# Patient Record
Sex: Male | Born: 1985
Health system: Southern US, Community
[De-identification: ages and names within clinical notes are randomized; demographics above are authoritative.]

## PROBLEM LIST (undated history)

## (undated) DIAGNOSIS — G4733 Obstructive sleep apnea (adult) (pediatric): Secondary | ICD-10-CM

## (undated) DIAGNOSIS — G4730 Sleep apnea, unspecified: Secondary | ICD-10-CM

## (undated) DIAGNOSIS — M5126 Other intervertebral disc displacement, lumbar region: Secondary | ICD-10-CM

## (undated) DIAGNOSIS — T7840XA Allergy, unspecified, initial encounter: Secondary | ICD-10-CM

## (undated) HISTORY — DX: Allergy, unspecified, initial encounter: T78.40XA

## (undated) HISTORY — DX: Sleep apnea, unspecified: G47.30

## (undated) HISTORY — DX: Other intervertebral disc displacement, lumbar region: M51.26

## (undated) HISTORY — DX: Obstructive sleep apnea (adult) (pediatric): G47.33

## (undated) HISTORY — PX: VASECTOMY: SHX75

---

## 2010-03-29 ENCOUNTER — Emergency Department: Payer: Self-pay | Admitting: Unknown Physician Specialty

## 2010-04-29 ENCOUNTER — Ambulatory Visit: Payer: Self-pay | Admitting: Pain Medicine

## 2010-05-17 ENCOUNTER — Ambulatory Visit: Payer: Self-pay | Admitting: Pain Medicine

## 2010-05-25 ENCOUNTER — Ambulatory Visit: Payer: Self-pay | Admitting: Pain Medicine

## 2010-06-01 ENCOUNTER — Ambulatory Visit: Payer: Self-pay | Admitting: Pain Medicine

## 2010-06-16 ENCOUNTER — Ambulatory Visit: Payer: Self-pay | Admitting: Pain Medicine

## 2010-09-19 IMAGING — CR DG LUMBAR SPINE 2-3V
1 series · 3 of 3 positions shown · non-contrast
Comparison: none

REASON FOR EXAM: baclk pain lower
COMMENTS:

[Series 1: view not recorded · 0.17mm/px · 3 of 3 slices shown]
[im 1/3]
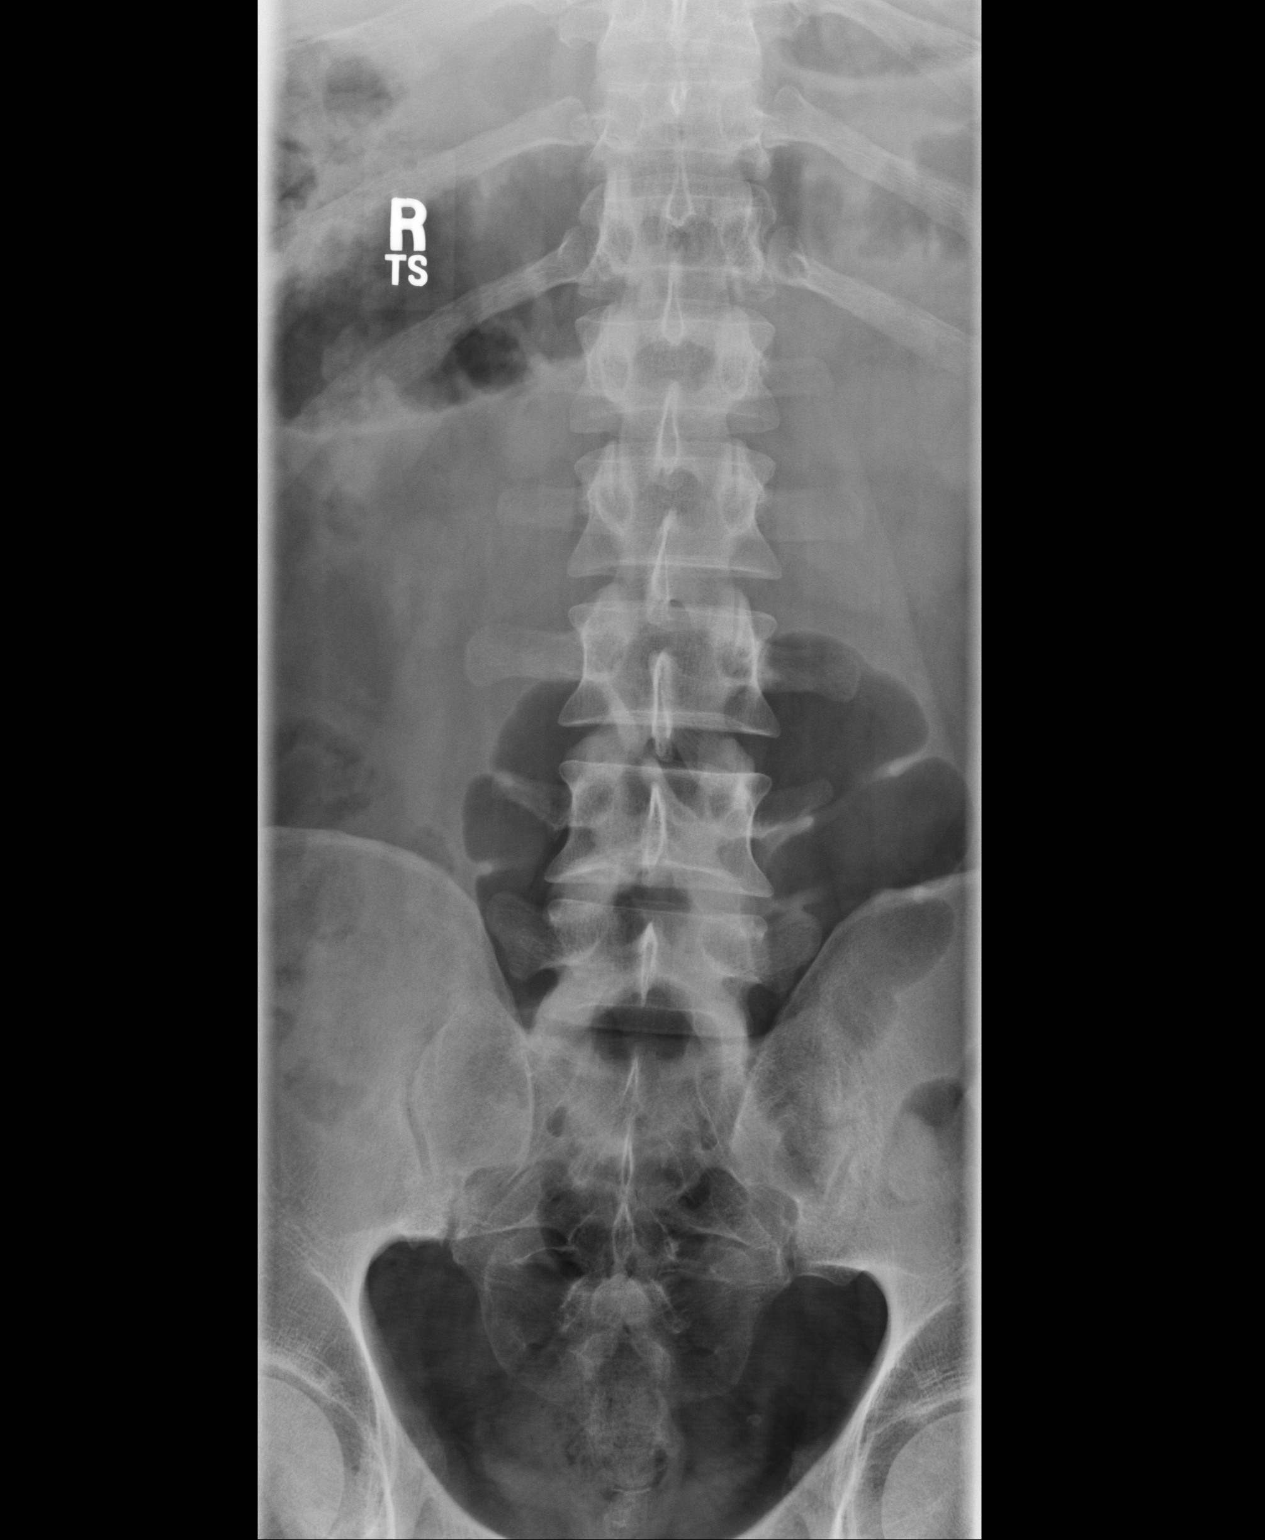
[im 2/3]
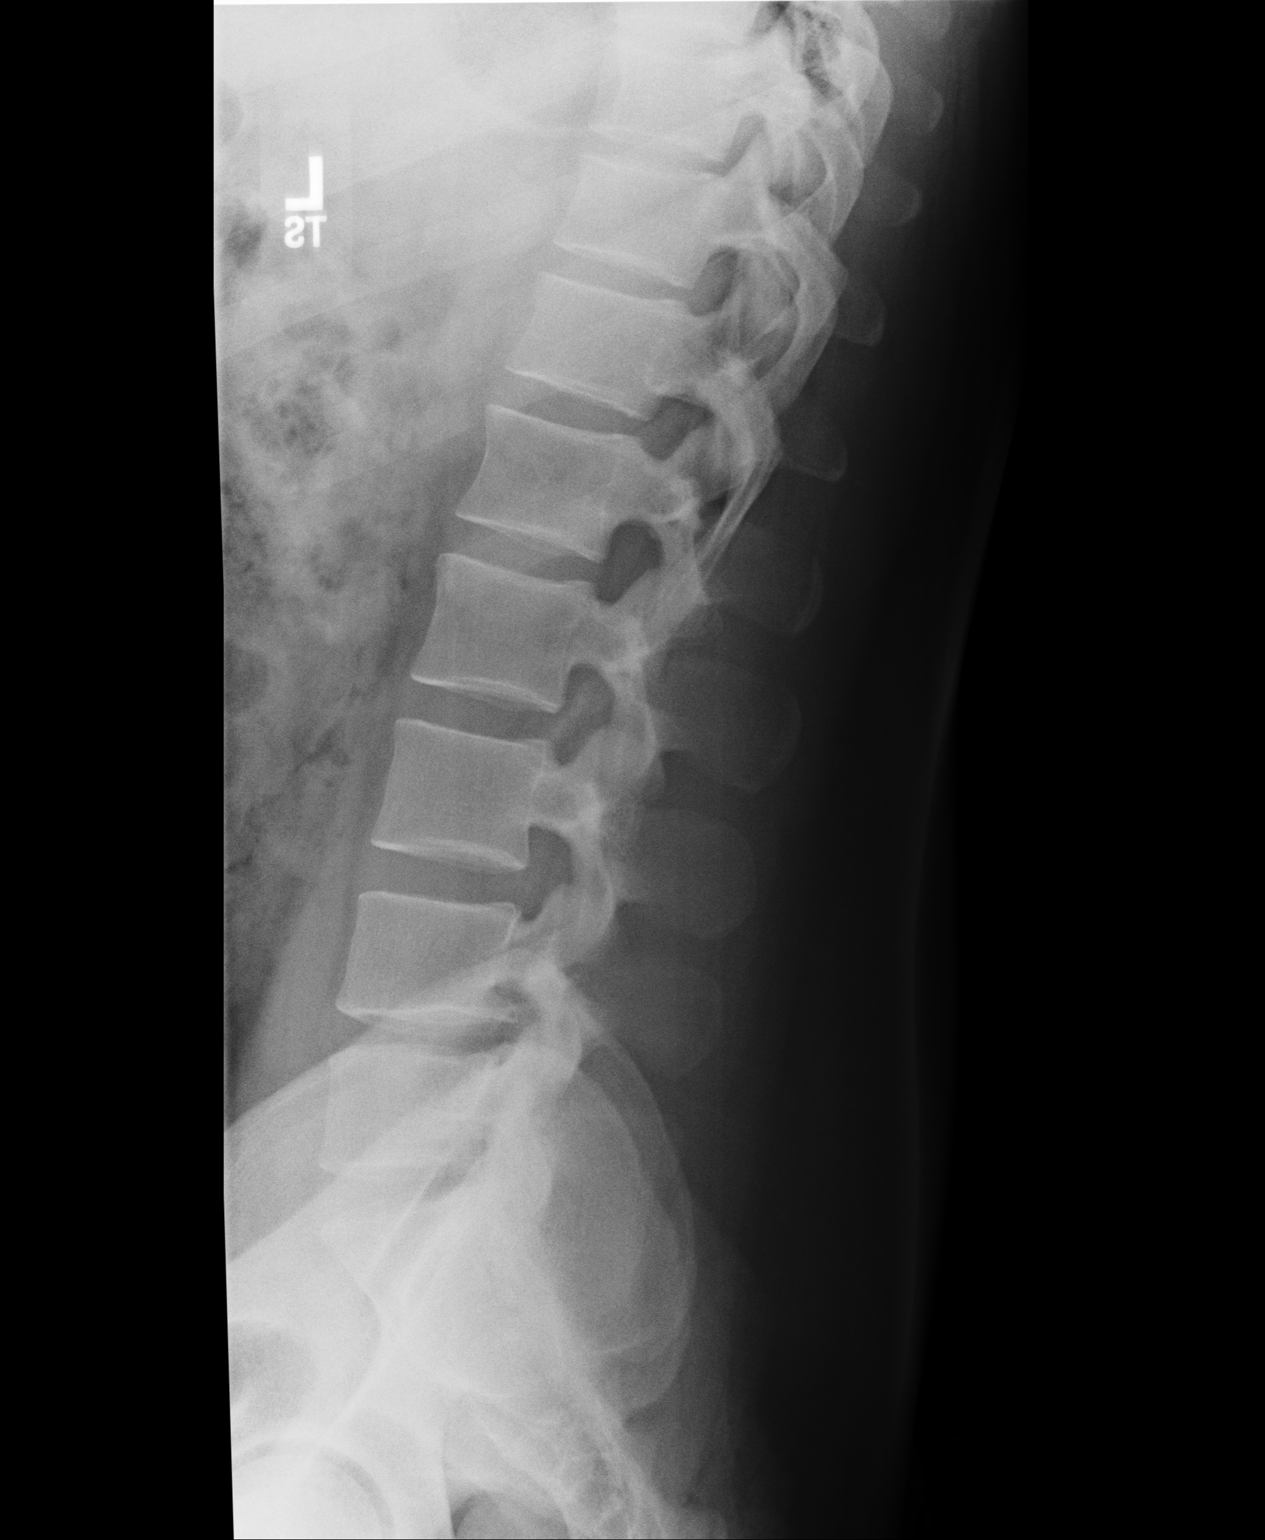
[im 3/3]
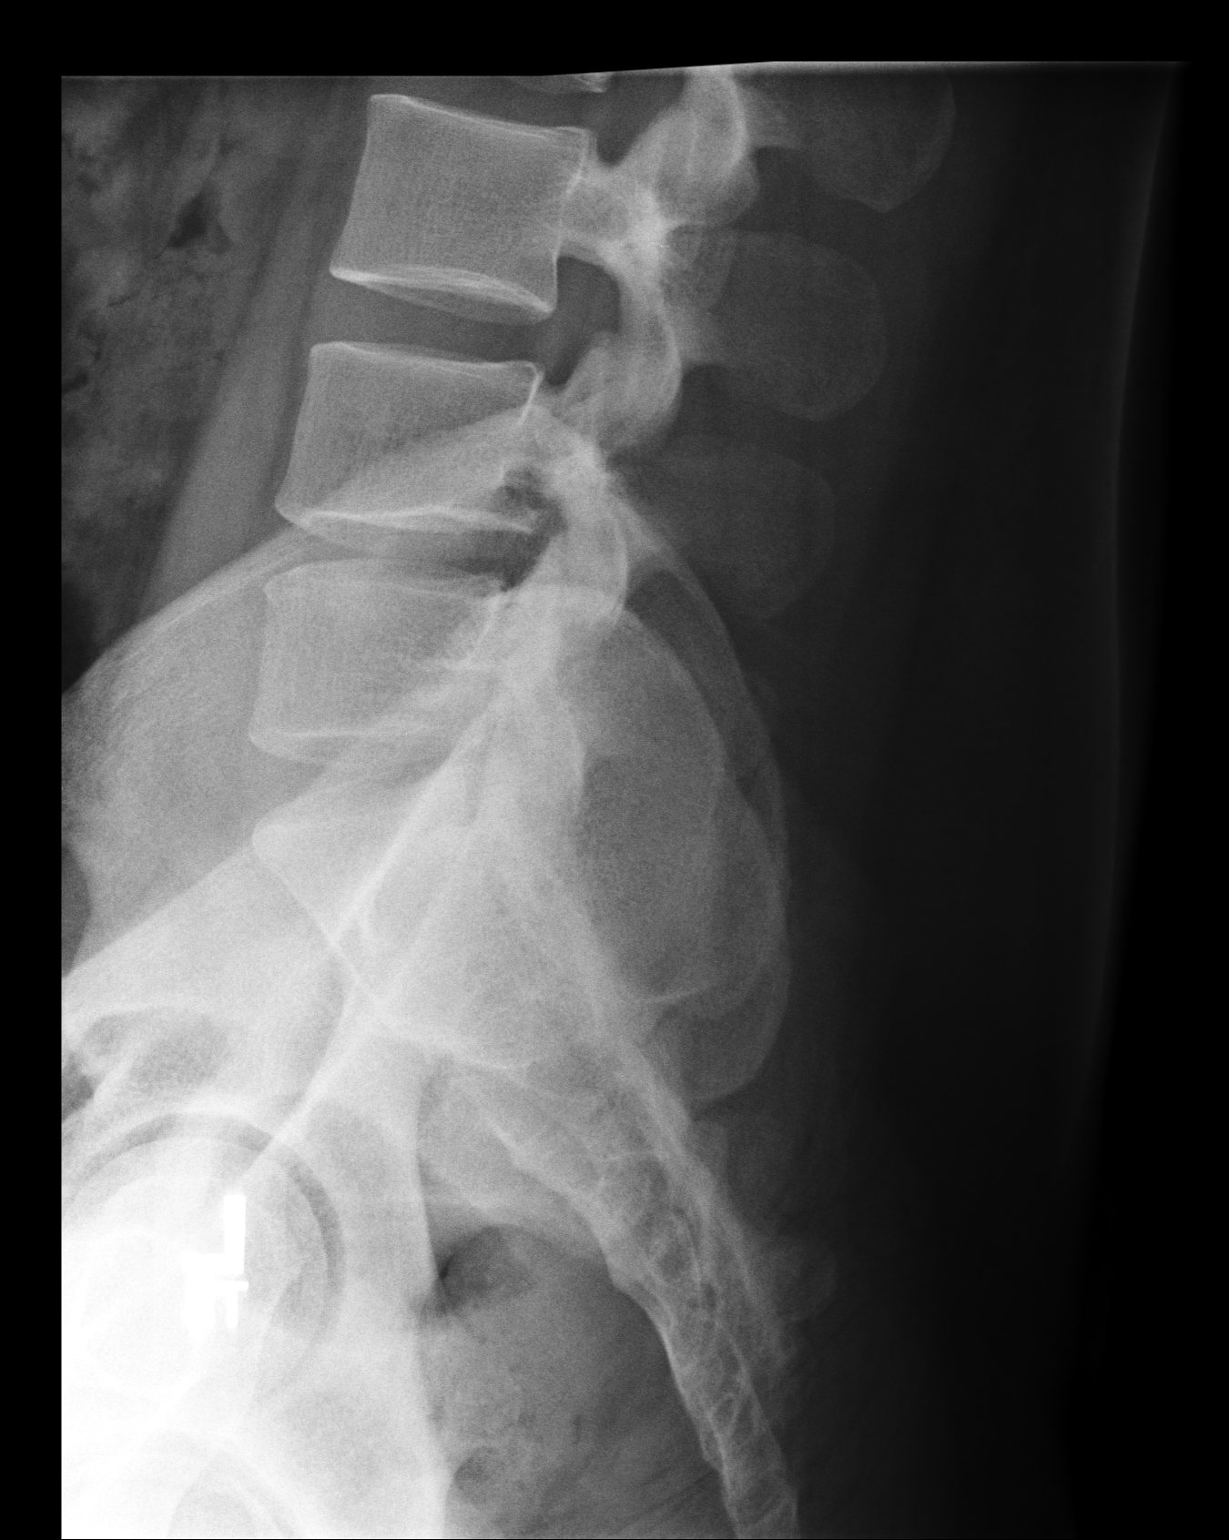

[3 of 3 positions shown; findings below may reference images not displayed]

PROCEDURE:     DXR - DXR LUMBAR SPINE AP AND LATERAL  - March 29, 2010 [DATE]

RESULT:     The vertebral body heights and intervertebral disc spaces appear
to be maintained. The alignment appears normal. No fracture or dislocation
is appreciated. No gross sacral or sacroiliac abnormality is demonstrated.
No radiopaque renal calculi are visualized.
IMPRESSION: No acute abnormality evident. MRI is available for further
investigation, if warranted.

## 2014-04-15 ENCOUNTER — Emergency Department: Payer: Self-pay | Admitting: Internal Medicine

## 2014-08-22 HISTORY — PX: ROOT CANAL: SHX2363
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
## 2015-10-20 ENCOUNTER — Encounter: Payer: Self-pay | Admitting: Family Medicine

## 2015-10-20 ENCOUNTER — Ambulatory Visit (INDEPENDENT_AMBULATORY_CARE_PROVIDER_SITE_OTHER): Payer: BLUE CROSS/BLUE SHIELD | Admitting: Family Medicine

## 2015-10-20 VITALS — BP 120/78 | HR 79 | Temp 98.7°F | Resp 16 | Ht 66.0 in | Wt 239.4 lb

## 2015-10-20 DIAGNOSIS — Z7189 Other specified counseling: Secondary | ICD-10-CM

## 2015-10-20 DIAGNOSIS — R059 Cough, unspecified: Secondary | ICD-10-CM | POA: Insufficient documentation

## 2015-10-20 DIAGNOSIS — R05 Cough: Secondary | ICD-10-CM

## 2015-10-20 DIAGNOSIS — Z7689 Persons encountering health services in other specified circumstances: Secondary | ICD-10-CM | POA: Insufficient documentation

## 2015-10-20 NOTE — Progress Notes (Signed)
Name: Miguel Johnston   MRN: [id number]    DOB: 07/06/1986   Date:10/20/2015       Progress Note  Subjective  Chief Complaint  Chief Complaint  Patient presents with  . Acute Visit    Cough/congestion x2 weeks    Cough This is a new problem. Episode onset: 2 weeks ago. The cough is productive of sputum. Pertinent negatives include no chills, fever, myalgias or shortness of breath. Associated symptoms comments: Had fever and myalgias with the onset of symptoms 2 weeks ago, now resolved. . He has tried OTC cough suppressant for the symptoms.  Cough started the day before acute URI symptoms 2 weeks ago. Has improved considerably  Patient is here to establish care, formerly seen by Dr. Charlette Caffey. Last visit in 2015.  past medical, surgical, family, and social history reviewed and discussed in detail.  Past Medical History  Diagnosis Date  . Allergy   . Lumbar herniated disc     Had injections to his lower back in 2011    Past Surgical History  Procedure Laterality Date  . Root canal Left 2016    Family History  Problem Relation Age of Onset  . Healthy Mother   . Healthy Father   . Cancer Paternal Grandfather     Colon CA, passed away at 75.    Social History   Social History  . Marital Status: Single    Spouse Name: N/A  . Number of Children: N/A  . Years of Education: N/A   Occupational History  . Not on file.   Social History Main Topics  . Smoking status: Former Smoker    Quit date: 10/19/2002  . Smokeless tobacco: Never Used  . Alcohol Use: No  . Drug Use: No  . Sexual Activity:    Partners: Female   Other Topics Concern  . Not on file   Social History Narrative  . No narrative on file    No current outpatient prescriptions on file.  No Known Allergies   Review of Systems  Constitutional: Negative for fever and chills.  Respiratory: Positive for cough. Negative for shortness of breath.   Musculoskeletal: Negative for myalgias.      Objective  Filed Vitals:   10/20/15 0944  BP: 120/78  Pulse: 79  Temp: 98.7 F (37.1 C)  TempSrc: Oral  Resp: 16  Height:  (1.676 m)  Weight: 239 lb 6.4 oz (108.591 kg)  SpO2: 97%    Physical Exam  Constitutional: He is oriented to person, place, and time and well-developed, well-nourished, and in no distress.  HENT:  Head: Normocephalic and atraumatic.  Right Ear: Tympanic membrane and ear canal normal.  Left Ear: Tympanic membrane and ear canal normal.  Mouth/Throat: No posterior oropharyngeal erythema.  Cardiovascular: Normal rate and regular rhythm.   Pulmonary/Chest: Effort normal and breath sounds normal.  Neurological: He is alert and oriented to person, place, and time.  Nursing note and vitals reviewed.     Assessment & Plan  1. Cough Almost resolved with conservative treatment and OTC antitussive therapy.  2. Encounter to establish care with new doctor Will schedule for a complete physical in 2 months.   Marcela Alatorre Asad A. Faylene Kurtz [facility] 10/20/2015 10:11 AM

## 2015-12-07 DIAGNOSIS — H5213 Myopia, bilateral: Secondary | ICD-10-CM | POA: Diagnosis not present

## 2015-12-18 ENCOUNTER — Encounter: Payer: BLUE CROSS/BLUE SHIELD | Admitting: Family Medicine

## 2016-02-12 ENCOUNTER — Ambulatory Visit (INDEPENDENT_AMBULATORY_CARE_PROVIDER_SITE_OTHER): Payer: BLUE CROSS/BLUE SHIELD | Admitting: Physician Assistant

## 2016-02-12 ENCOUNTER — Encounter: Payer: Self-pay | Admitting: Physician Assistant

## 2016-02-12 VITALS — BP 138/90 | HR 79 | Temp 98.6°F | Resp 16 | Ht 68.0 in | Wt 243.6 lb

## 2016-02-12 DIAGNOSIS — J069 Acute upper respiratory infection, unspecified: Secondary | ICD-10-CM | POA: Diagnosis not present

## 2016-02-12 DIAGNOSIS — Z7689 Persons encountering health services in other specified circumstances: Secondary | ICD-10-CM

## 2016-02-12 DIAGNOSIS — Z7189 Other specified counseling: Secondary | ICD-10-CM | POA: Diagnosis not present

## 2016-02-12 DIAGNOSIS — R05 Cough: Secondary | ICD-10-CM | POA: Diagnosis not present

## 2016-02-12 DIAGNOSIS — R059 Cough, unspecified: Secondary | ICD-10-CM

## 2016-02-12 DIAGNOSIS — H66001 Acute suppurative otitis media without spontaneous rupture of ear drum, right ear: Secondary | ICD-10-CM | POA: Diagnosis not present

## 2016-02-12 MED ORDER — AMOXICILLIN 875 MG PO TABS: 875.0000 mg | ORAL_TABLET | Freq: Two times a day (BID) | ORAL | Status: DC

## 2016-02-12 NOTE — Progress Notes (Signed)
Patient: Miguel Johnston Male    DOB: 1986-02-28   30 y.o.   MRN: [id number] Visit Date: 02/12/2016  Today's Provider: Margaretann LovelessJennifer M Yaire Kreher, PA-C   Chief Complaint  Patient presents with  . URI   Subjective:    URI  This is a new problem. The current episode started 1 to 4 weeks ago (It started at the beginning of last weekend.). The problem has been gradually worsening. Maximum temperature: Was feeling warm and having chills. Did not measure temperature. Associated symptoms include congestion, coughing, ear pain (right ear), headaches (off and on), a plugged ear sensation, rhinorrhea, sinus pain, sneezing and a sore throat (off and on with the cough). Pertinent negatives include no chest pain, diarrhea, dysuria, joint pain, joint swelling, nausea, neck pain, rash, vomiting or wheezing. He has tried acetaminophen (Sinus Cold. Tylenol last night) for the symptoms. The treatment provided no relief.      No Known Allergies No outpatient prescriptions have been marked as taking for the 02/12/16 encounter (Office Visit) with Margaretann LovelessJennifer M Jasdeep Kepner, PA-C.    Review of Systems  Constitutional: Positive for fever, chills and diaphoresis.  HENT: Positive for congestion, ear pain (right ear), rhinorrhea, sinus pressure, sneezing and sore throat (off and on with the cough).   Eyes: Negative.   Respiratory: Positive for cough. Negative for wheezing.   Cardiovascular: Negative.  Negative for chest pain.  Gastrointestinal: Negative.  Negative for nausea, vomiting and diarrhea.  Endocrine: Negative.   Genitourinary: Negative.  Negative for dysuria.  Musculoskeletal: Negative.  Negative for joint pain and neck pain.  Skin: Negative.  Negative for rash.  Allergic/Immunologic: Negative.   Neurological: Negative.  Positive for headaches (off and on).  Hematological: Negative.   Psychiatric/Behavioral: Negative.     Social History  Substance Use Topics  . Smoking status: Former Smoker    Quit  date: 10/19/2002  . Smokeless tobacco: Never Used  . Alcohol Use: No   Objective:   BP 138/90 mmHg  Pulse 79  Temp(Src) 98.6 F (37 C) (Oral)  Resp 16  Ht 5\' 8"  (1.727 m)  Wt 243 lb 9.6 oz (110.496 kg)  BMI 37.05 kg/m2  SpO2 97%  Physical Exam  Constitutional: He appears well-developed and well-nourished. No distress.  HENT:  Head: Normocephalic and atraumatic.  Right Ear: Hearing, external ear and ear canal normal. Tympanic membrane is erythematous and bulging. A middle ear effusion is present.  Left Ear: Hearing, tympanic membrane, external ear and ear canal normal. Tympanic membrane is not erythematous and not bulging.  No middle ear effusion.  Nose: Mucosal edema and rhinorrhea present. Right sinus exhibits no maxillary sinus tenderness and no frontal sinus tenderness. Left sinus exhibits no maxillary sinus tenderness and no frontal sinus tenderness.  Mouth/Throat: Uvula is midline and mucous membranes are normal. Posterior oropharyngeal erythema present. No oropharyngeal exudate or posterior oropharyngeal edema.  Eyes: Conjunctivae and EOM are normal. Pupils are equal, round, and reactive to light. Right eye exhibits no discharge. Left eye exhibits no discharge.  Neck: Normal range of motion. Neck supple. No tracheal deviation present. No Brudzinski's sign and no Kernig's sign noted. No thyromegaly present.  Cardiovascular: Normal rate, regular rhythm and normal heart sounds.  Exam reveals no gallop and no friction rub.   No murmur heard. Pulmonary/Chest: Effort normal and breath sounds normal. No stridor. No respiratory distress. He has no wheezes. He has no rales.  Lymphadenopathy:    He has no cervical adenopathy.  Skin: Skin is warm and dry. He is not diaphoretic.  Vitals reviewed.     Assessment & Plan:     1. Acute suppurative otitis media of right ear without spontaneous rupture of tympanic membrane, recurrence not specified Worsening symptoms. I will treat with  amoxicillin as below. He needs to make sure to stay well-hydrated and get plenty of rest. He is going to use Delsym for cough suppression. He is to call the office if symptoms fail to improve or worsen. - amoxicillin (AMOXIL) 875 MG tablet; Take 1 tablet (875 mg total) by mouth 2 (two) times daily.  Dispense: 10 tablet; Refill: 0  2. Upper respiratory infection See above medical treatment plan.  3. Cough See above medical treatment plan.  4. Establishing care with new doctor, encounter for Previous patient of Dr. Thana AtesMorrisey, [facility]. Will return for CPE in near future. May schedule at patient's discretion as he requires a Friday afternoon.       Margaretann LovelessJennifer M Cheral Cappucci, PA-C  [facility]

## 2016-02-12 NOTE — Patient Instructions (Signed)
Otitis Media With Effusion °Otitis media with effusion is the presence of fluid in the middle ear. This is a common problem in children, which often follows ear infections. It may be present for weeks or longer after the infection. Unlike an acute ear infection, otitis media with effusion refers only to fluid behind the ear drum and not infection. Children with repeated ear and sinus infections and allergy problems are the most likely to get otitis media with effusion. °CAUSES  °The most frequent cause of the fluid buildup is dysfunction of the eustachian tubes. These are the tubes that drain fluid in the ears to the back of the nose (nasopharynx). °SYMPTOMS  °· The main symptom of this condition is hearing loss. As a result, you or your child may: °¨ Listen to the TV at a loud volume. °¨ Not respond to questions. °¨ Ask "what" often when spoken to. °¨ Mistake or confuse one sound or word for another. °· There may be a sensation of fullness or pressure but usually not pain. °DIAGNOSIS  °· Your health care provider will diagnose this condition by examining you or your child's ears. °· Your health care provider may test the pressure in you or your child's ear with a tympanometer. °· A hearing test may be conducted if the problem persists. °TREATMENT  °· Treatment depends on the duration and the effects of the effusion. °· Antibiotics, decongestants, nose drops, and cortisone-type drugs (tablets or nasal spray) may not be helpful. °· Children with persistent ear effusions may have delayed language or behavioral problems. Children at risk for developmental delays in hearing, learning, and speech may require referral to a specialist earlier than children not at risk. °· You or your child's health care provider may suggest a referral to an ear, nose, and throat surgeon for treatment. The following may help restore normal hearing: °¨ Drainage of fluid. °¨ Placement of ear tubes (tympanostomy tubes). °¨ Removal of adenoids  (adenoidectomy). °HOME CARE INSTRUCTIONS  °· Avoid secondhand smoke. °· Infants who are breastfed are less likely to have this condition. °· Avoid feeding infants while they are lying flat. °· Avoid known environmental allergens. °· Avoid people who are sick. °SEEK MEDICAL CARE IF:  °· Hearing is not better in 3 months. °· Hearing is worse. °· Ear pain. °· Drainage from the ear. °· Dizziness. °MAKE SURE YOU:  °· Understand these instructions. °· Will watch your condition. °· Will get help right away if you are not doing well or get worse. °  °This information is not intended to replace advice given to you by your health care provider. Make sure you discuss any questions you have with your health care provider. °  °Document Released: 09/15/2004 Document Revised: 08/29/2014 Document Reviewed: 03/05/2013 °Elsevier Interactive Patient Education ©2016 Elsevier Inc. °Upper Respiratory Infection, Adult °Most upper respiratory infections (URIs) are a viral infection of the air passages leading to the lungs. A URI affects the nose, throat, and upper air passages. The most common type of URI is nasopharyngitis and is typically referred to as "the common cold." °URIs run their course and usually go away on their own. Most of the time, a URI does not require medical attention, but sometimes a bacterial infection in the upper airways can follow a viral infection. This is called a secondary infection. Sinus and middle ear infections are common types of secondary upper respiratory infections. °Bacterial pneumonia can also complicate a URI. A URI can worsen asthma and chronic obstructive pulmonary disease (  COPD). Sometimes, these complications can require emergency medical care and may be life threatening.  CAUSES Almost all URIs are caused by viruses. A virus is a type of germ and can spread from one person to another.  RISKS FACTORS You may be at risk for a URI if:   You smoke.   You have chronic heart or lung  disease.  You have a weakened defense (immune) system.   You are very young or very old.   You have nasal allergies or asthma.  You work in crowded or poorly ventilated areas.  You work in health care facilities or schools. SIGNS AND SYMPTOMS  Symptoms typically develop 2-3 days after you come in contact with a cold virus. Most viral URIs last 7-10 days. However, viral URIs from the influenza virus (flu virus) can last 14-18 days and are typically more severe. Symptoms may include:   Runny or stuffy (congested) nose.   Sneezing.   Cough.   Sore throat.   Headache.   Fatigue.   Fever.   Loss of appetite.   Pain in your forehead, behind your eyes, and over your cheekbones (sinus pain).  Muscle aches.  DIAGNOSIS  Your health care provider may diagnose a URI by:  Physical exam.  Tests to check that your symptoms are not due to another condition such as:  Strep throat.  Sinusitis.  Pneumonia.  Asthma. TREATMENT  A URI goes away on its own with time. It cannot be cured with medicines, but medicines may be prescribed or recommended to relieve symptoms. Medicines may help:  Reduce your fever.  Reduce your cough.  Relieve nasal congestion. HOME CARE INSTRUCTIONS   Take medicines only as directed by your health care provider.   Gargle warm saltwater or take cough drops to comfort your throat as directed by your health care provider.  Use a warm mist humidifier or inhale steam from a shower to increase air moisture. This may make it easier to breathe.  Drink enough fluid to keep your urine clear or pale yellow.   Eat soups and other clear broths and maintain good nutrition.   Rest as needed.   Return to work when your temperature has returned to normal or as your health care provider advises. You may need to stay home longer to avoid infecting others. You can also use a face mask and careful hand washing to prevent spread of the  virus.  Increase the usage of your inhaler if you have asthma.   Do not use any tobacco products, including cigarettes, chewing tobacco, or electronic cigarettes. If you need help quitting, ask your health care provider. PREVENTION  The best way to protect yourself from getting a cold is to practice good hygiene.   Avoid oral or hand contact with people with cold symptoms.   Wash your hands often if contact occurs.  There is no clear evidence that vitamin C, vitamin E, echinacea, or exercise reduces the chance of developing a cold. However, it is always recommended to get plenty of rest, exercise, and practice good nutrition.  SEEK MEDICAL CARE IF:   You are getting worse rather than better.   Your symptoms are not controlled by medicine.   You have chills.  You have worsening shortness of breath.  You have brown or red mucus.  You have yellow or brown nasal discharge.  You have pain in your face, especially when you bend forward.  You have a fever.  You have swollen neck glands.  You have pain while swallowing.  You have white areas in the back of your throat. SEEK IMMEDIATE MEDICAL CARE IF:   You have severe or persistent:  Headache.  Ear pain.  Sinus pain.  Chest pain.  You have chronic lung disease and any of the following:  Wheezing.  Prolonged cough.  Coughing up blood.  A change in your usual mucus.  You have a stiff neck.  You have changes in your:  Vision.  Hearing.  Thinking.  Mood. MAKE SURE YOU:   Understand these instructions.  Will watch your condition.  Will get help right away if you are not doing well or get worse.   This information is not intended to replace advice given to you by your health care provider. Make sure you discuss any questions you have with your health care provider.   Document Released: 02/01/2001 Document Revised: 12/23/2014 Document Reviewed: 11/13/2013 Elsevier Interactive Patient Education  [employer] Else[employer] Inc[employer]

## 2016-03-25 ENCOUNTER — Encounter: Payer: Self-pay | Admitting: Physician Assistant

## 2016-03-25 ENCOUNTER — Encounter: Payer: BLUE CROSS/BLUE SHIELD | Admitting: Physician Assistant

## 2016-03-25 NOTE — Progress Notes (Deleted)
       Patient: Miguel Johnston, Male    DOB: 10-16-1985, 30 y.o.   MRN: [id number] Visit Date: 03/25/2016  Today's Provider: Margaretann Loveless, PA-C   No chief complaint on file.  Subjective:    Annual physical exam Miguel Johnston is a 30 y.o. male who presents today for health maintenance and complete physical. He feels {DESC; WELL/FAIRLY WELL/POORLY:18703}. He reports exercising ***. He reports he is sleeping {DESC; WELL/FAIRLY WELL/POORLY:18703}.  -----------------------------------------------------------------   Review of Systems  Social History      He  reports that he quit smoking about 13 years ago. He has never used smokeless tobacco. He reports that he does not drink alcohol or use drugs.       Social History   Social History  . Marital status: Single    Spouse name: N/A  . Number of children: N/A  . Years of education: N/A   Social History Main Topics  . Smoking status: Former Smoker    Quit date: 10/19/2002  . Smokeless tobacco: Never Used  . Alcohol use No  . Drug use: No  . Sexual activity: Yes    Partners: Female   Other Topics Concern  . None   Social History Narrative  . None    Past Medical History:  Diagnosis Date  . Allergy   . Lumbar herniated disc    Had injections to his lower back in 2011     Patient Active Problem List   Diagnosis Date Noted  . Cough 10/20/2015  . Encounter to establish care with new doctor 10/20/2015    Past Surgical History:  Procedure Laterality Date  . ROOT CANAL Left 2016    Family History        Family Status  Relation Status  . Mother Alive  . Father Alive        His family history includes Cancer in his paternal grandfather; Healthy in his father and mother.    No Known Allergies  No outpatient prescriptions have been marked as taking for the 03/25/16 encounter (Appointment) with Margaretann Loveless, PA-C.    Patient Care Team: Margaretann Loveless, PA-C as PCP - General (Family Medicine)     Objective:   Vitals: There were no vitals taken for this visit.   Physical Exam   Depression Screen PHQ 2/9 Scores 10/20/2015  PHQ - 2 Score 0      Assessment & Plan:     Routine Health Maintenance and Physical Exam  Exercise Activities and Dietary recommendations Goals    None      Immunization History  Administered Date(s) Administered  . Influenza-Unspecified 05/23/2015    Health Maintenance  Topic Date Due  . HIV Screening  11/17/1998  . TETANUS/TDAP  11/17/2002  . INFLUENZA VACCINE  03/22/2016      Discussed health benefits of physical activity, and encouraged him to engage in regular exercise appropriate for his age and condition.    --------------------------------------------------------------------    Margaretann Loveless, PA-C  [facility]

## 2016-12-08 DIAGNOSIS — H5213 Myopia, bilateral: Secondary | ICD-10-CM | POA: Diagnosis not present

## 2017-01-06 ENCOUNTER — Encounter: Payer: Self-pay | Admitting: Physician Assistant

## 2017-01-06 ENCOUNTER — Ambulatory Visit (INDEPENDENT_AMBULATORY_CARE_PROVIDER_SITE_OTHER): Payer: BLUE CROSS/BLUE SHIELD | Admitting: Physician Assistant

## 2017-01-06 VITALS — BP 120/80 | HR 72 | Temp 98.2°F | Resp 16 | Ht 68.0 in | Wt 257.4 lb

## 2017-01-06 DIAGNOSIS — Z114 Encounter for screening for human immunodeficiency virus [HIV]: Secondary | ICD-10-CM | POA: Diagnosis not present

## 2017-01-06 DIAGNOSIS — Z13220 Encounter for screening for lipoid disorders: Secondary | ICD-10-CM

## 2017-01-06 DIAGNOSIS — Z833 Family history of diabetes mellitus: Secondary | ICD-10-CM

## 2017-01-06 DIAGNOSIS — Z3009 Encounter for other general counseling and advice on contraception: Secondary | ICD-10-CM | POA: Diagnosis not present

## 2017-01-06 DIAGNOSIS — E669 Obesity, unspecified: Secondary | ICD-10-CM | POA: Diagnosis not present

## 2017-01-06 DIAGNOSIS — Z9109 Other allergy status, other than to drugs and biological substances: Secondary | ICD-10-CM | POA: Diagnosis not present

## 2017-01-06 DIAGNOSIS — Z23 Encounter for immunization: Secondary | ICD-10-CM | POA: Diagnosis not present

## 2017-01-06 DIAGNOSIS — Z136 Encounter for screening for cardiovascular disorders: Secondary | ICD-10-CM

## 2017-01-06 DIAGNOSIS — Z0000 Encounter for general adult medical examination without abnormal findings: Secondary | ICD-10-CM

## 2017-01-06 NOTE — Progress Notes (Signed)
Patient: Miguel Johnston, Male    DOB: Dec 15, 1985, 31 y.o.   MRN: [id number] Visit Date: 01/06/2017  Today's Provider: Margaretann LovelessJennifer M Burnette, PA-C   Chief Complaint  Patient presents with  . Annual Exam   Subjective:    Annual physical exam Miguel Johnston is a 31 y.o. male who presents today for health maintenance and complete physical. He feels well. He reports exercising 5 days a weeks. He reports he is sleeping well.  Patient is requesting referral to urology for vasectomy.  -----------------------------------------------------------------   Review of Systems  Constitutional: Negative.   HENT: Positive for sinus pressure.   Eyes: Negative.   Respiratory: Negative.   Cardiovascular: Negative.   Gastrointestinal: Negative.   Endocrine: Negative.   Genitourinary: Negative.   Musculoskeletal: Negative.   Skin: Negative.   Allergic/Immunologic: Positive for environmental allergies.  Neurological: Negative.   Hematological: Negative.   Psychiatric/Behavioral: Negative.     Social History      He  reports that he quit smoking about 14 years ago. He has never used smokeless tobacco. He reports that he does not drink alcohol or use drugs.       Social History   Social History  . Marital status: Married    Spouse name: N/A  . Number of children: 2  . Years of education: N/A   Social History Main Topics  . Smoking status: Former Smoker    Quit date: 10/19/2002  . Smokeless tobacco: Never Used  . Alcohol use No  . Drug use: No  . Sexual activity: Yes    Partners: Female   Other Topics Concern  . None   Social History Narrative  . None    Past Medical History:  Diagnosis Date  . Allergy   . Lumbar herniated disc    Had injections to his lower back in 2011     Patient Active Problem List   Diagnosis Date Noted  . Cough 10/20/2015  . Encounter to establish care with new doctor 10/20/2015    Past Surgical History:  Procedure Laterality Date  .  ROOT CANAL Left 2016    Family History        Family Status  Relation Status  . Mother Alive  . Father Alive  . PGF Deceased  . Sister Alive  . Brother Alive  . Mat [employer] Mutualunt Alive  . MGM Alive  . Brother Alive  . Brother Alive  . Daughter Alive  . Son Alive        His family history includes Cancer in his paternal grandfather; Diabetes in his maternal aunt; Healthy in his brother, brother, brother, father, mother, and sister; Heart disease in his maternal grandmother.     No Known Allergies  No current outpatient prescriptions on file.   Patient Care Team: Reine JustBurnette, Jennifer M, PA-C as PCP - General (Family Medicine)      Objective:   Vitals: BP 120/80 (BP Location: Left Arm, Patient Position: Sitting, Cuff Size: Large)   Pulse 72   Temp 98.2 F (36.8 C) (Oral)   Resp 16   Ht 5\' 8"  (1.727 m)   Wt 257 lb 6.4 oz (116.8 kg)   SpO2 97%   BMI 39.14 kg/m    Vitals:   01/06/17 1423  BP: 120/80  Pulse: 72  Resp: 16  Temp: 98.2 F (36.8 C)  TempSrc: Oral  SpO2: 97%  Weight: 257 lb 6.4 oz (116.8 kg)  Height: 5\' 8"  (1.727  m)     Physical Exam  Constitutional: He is oriented to person, place, and time. He appears well-developed and well-nourished.  HENT:  Head: Normocephalic and atraumatic.  Right Ear: Hearing, tympanic membrane, external ear and ear canal normal.  Left Ear: Hearing, tympanic membrane, external ear and ear canal normal.  Nose: Nose normal.  Mouth/Throat: Uvula is midline, oropharynx is clear and moist and mucous membranes are normal.  Eyes: Conjunctivae and EOM are normal. Pupils are equal, round, and reactive to light. Right eye exhibits no discharge.  Neck: Normal range of motion. Neck supple. No tracheal deviation present. No thyromegaly present.  Cardiovascular: Normal rate, regular rhythm, normal heart sounds and intact distal pulses.   No murmur heard. Pulmonary/Chest: Effort normal and breath sounds normal. No respiratory distress. He has no  wheezes. He has no rales. He exhibits no tenderness.  Abdominal: Soft. He exhibits no distension and no mass. There is no tenderness. There is no rebound and no guarding.  Musculoskeletal: Normal range of motion. He exhibits no edema or tenderness.  Lymphadenopathy:    He has no cervical adenopathy.  Neurological: He is alert and oriented to person, place, and time. He has normal reflexes. No cranial nerve deficit. He exhibits normal muscle tone. Coordination normal.  Skin: Skin is warm and dry. No rash noted. No erythema.  Psychiatric: He has a normal mood and affect. His behavior is normal. Judgment and thought content normal.     Depression Screen PHQ 2/9 Scores 01/06/2017 10/20/2015  PHQ - 2 Score 0 0  PHQ- 9 Score 0 -      Assessment & Plan:     Routine Health Maintenance and Physical Exam  Exercise Activities and Dietary recommendations Goals    None      Immunization History  Administered Date(s) Administered  . Influenza-Unspecified 05/23/2015    Health Maintenance  Topic Date Due  . HIV Screening  11/17/1998  . TETANUS/TDAP  11/17/2002  . INFLUENZA VACCINE  03/22/2017     Discussed health benefits of physical activity, and encouraged him to engage in regular exercise appropriate for his age and condition.    1. Annual physical exam Normal physical exam today. Will check labs as below and f/u pending lab results. If labs are stable and WNL he will not need to have these rechecked for one year at his next annual physical exam. He is to call the office in the meantime if he has any acute issue, questions or concerns. - CBC with Differential/Platelet - Comprehensive metabolic panel - Hemoglobin A1c - TSH  2. Need for Tdap vaccination Tdap Vaccine given to patient without complications. Patient sat for 15 minutes after administration and was tolerated well without adverse effects. - Tdap vaccine greater than or equal to 7yo IM  3. Screening for HIV (human  immunodeficiency virus) - HIV antibody  4. Obesity (BMI 30-39.9) Counseled patient on healthy lifestyle modifications including dieting and exercise. Will check labs as below and f/u pending results. - TSH - Lipid panel  5. Environmental allergies Stable. Advised to add flonase sensimist. Call if no improvements.  6. Encounter for lipid screening for cardiovascular disease Will check labs as below and f/u pending results. - Lipid panel  7. Family history of diabetes mellitus Will check labs as below and f/u pending results. - Hemoglobin A1c  8. Visit for vasectomy evaluation Will place referral as below for consultation of vasectomy. - Ambulatory referral to Urology  --------------------------------------------------------------------    Alessandra Bevels  Marlyn Corporal, PA-C  [facility]

## 2017-01-06 NOTE — Patient Instructions (Signed)
 Health Maintenance, Male A healthy lifestyle and preventive care is important for your health and wellness. Ask your health care provider about what schedule of regular examinations is right for you. What should I know about weight and diet?  Eat a Healthy Diet  Eat plenty of vegetables, fruits, whole grains, low-fat dairy products, and lean protein.  Do not eat a lot of foods high in solid fats, added sugars, or salt. Maintain a Healthy Weight  Regular exercise can help you achieve or maintain a healthy weight. You should:  Do at least 150 minutes of exercise each week. The exercise should increase your heart rate and make you sweat (moderate-intensity exercise).  Do strength-training exercises at least twice a week. Watch Your Levels of Cholesterol and Blood Lipids  Have your blood tested for lipids and cholesterol every 5 years starting at 33 years of age. If you are at high risk for heart disease, you should start having your blood tested when you are 33 years old. You may need to have your cholesterol levels checked more often if:  Your lipid or cholesterol levels are high.  You are older than 33 years of age.  You are at high risk for heart disease. What should I know about cancer screening? Many types of cancers can be detected early and may often be prevented. Lung Cancer  You should be screened every year for lung cancer if:  You are a current smoker who has smoked for at least 30 years.  You are a former smoker who has quit within the past 15 years.  Talk to your health care provider about your screening options, when you should start screening, and how often you should be screened. Colorectal Cancer  Routine colorectal cancer screening usually begins at 33 years of age and should be repeated every 5-10 years until you are 33 years old. You may need to be screened more often if early forms of precancerous polyps or small growths are found. Your health care provider  may recommend screening at an earlier age if you have risk factors for colon cancer.  Your health care provider may recommend using home test kits to check for hidden blood in the stool.  A small camera at the end of a tube can be used to examine your colon (sigmoidoscopy or colonoscopy). This checks for the earliest forms of colorectal cancer. Prostate and Testicular Cancer  Depending on your age and overall health, your health care provider may do certain tests to screen for prostate and testicular cancer.  Talk to your health care provider about any symptoms or concerns you have about testicular or prostate cancer. Skin Cancer  Check your skin from head to toe regularly.  Tell your health care provider about any new moles or changes in moles, especially if:  There is a change in a mole's size, shape, or color.  You have a mole that is larger than a pencil eraser.  Always use sunscreen. Apply sunscreen liberally and repeat throughout the day.  Protect yourself by wearing long sleeves, pants, a wide-brimmed hat, and sunglasses when outside. What should I know about heart disease, diabetes, and high blood pressure?  If you are 18-39 years of age, have your blood pressure checked every 3-5 years. If you are 40 years of age or older, have your blood pressure checked every year. You should have your blood pressure measured twice-once when you are at a hospital or clinic, and once when you are not at   a hospital or clinic. Record the average of the two measurements. To check your blood pressure when you are not at a hospital or clinic, you can use:  An automated blood pressure machine at a pharmacy.  A home blood pressure monitor.  Talk to your health care provider about your target blood pressure.  If you are between 45-79 years old, ask your health care provider if you should take aspirin to prevent heart disease.  Have regular diabetes screenings by checking your fasting blood sugar  level.  If you are at a normal weight and have a low risk for diabetes, have this test once every three years after the age of 45.  If you are overweight and have a high risk for diabetes, consider being tested at a younger age or more often.  A one-time screening for abdominal aortic aneurysm (AAA) by ultrasound is recommended for men aged 65-75 years who are current or former smokers. What should I know about preventing infection? Hepatitis B  If you have a higher risk for hepatitis B, you should be screened for this virus. Talk with your health care provider to find out if you are at risk for hepatitis B infection. Hepatitis C  Blood testing is recommended for:  Everyone born from 1945 through 1965.  Anyone with known risk factors for hepatitis C. Sexually Transmitted Diseases (STDs)  You should be screened each year for STDs including gonorrhea and chlamydia if:  You are sexually active and are younger than 33 years of age.  You are older than 33 years of age and your health care provider tells you that you are at risk for this type of infection.  Your sexual activity has changed since you were last screened and you are at an increased risk for chlamydia or gonorrhea. Ask your health care provider if you are at risk.  Talk with your health care provider about whether you are at high risk of being infected with HIV. Your health care provider may recommend a prescription medicine to help prevent HIV infection. What else can I do?  Schedule regular health, dental, and eye exams.  Stay current with your vaccines (immunizations).  Do not use any tobacco products, such as cigarettes, chewing tobacco, and e-cigarettes. If you need help quitting, ask your health care provider.  Limit alcohol intake to no more than 2 drinks per day. One drink equals 12 ounces of beer, 5 ounces of wine, or 1 ounces of hard liquor.  Do not use street drugs.  Do not share needles.  Ask your health  care provider for help if you need support or information about quitting drugs.  Tell your health care provider if you often feel depressed.  Tell your health care provider if you have ever been abused or do not feel safe at home. This information is not intended to replace advice given to you by your health care provider. Make sure you discuss any questions you have with your health care provider. Document Released: 02/04/2008 Document Revised: 04/06/2016 Document Reviewed: 05/12/2015 Elsevier Interactive Patient Education  2017 Elsevier Inc.  

## 2017-01-17 ENCOUNTER — Ambulatory Visit (INDEPENDENT_AMBULATORY_CARE_PROVIDER_SITE_OTHER): Payer: BLUE CROSS/BLUE SHIELD | Admitting: Urology

## 2017-01-17 ENCOUNTER — Encounter: Payer: Self-pay | Admitting: Urology

## 2017-01-17 VITALS — BP 140/90 | HR 97 | Ht 71.0 in | Wt 250.0 lb

## 2017-01-17 DIAGNOSIS — Z3009 Encounter for other general counseling and advice on contraception: Secondary | ICD-10-CM

## 2017-01-17 MED ORDER — DIAZEPAM 10 MG PO TABS: 10.0000 mg | ORAL_TABLET | Freq: Once | ORAL | 0 refills | Status: AC

## 2017-01-17 NOTE — Progress Notes (Signed)
01/17/2017 3:55 PM   Ashby Dawes 03/21/1986 [id number]  Referring provider: Margaretann Loveless, PA-C [address] [place], [place] [address]  Chief Complaint  Patient presents with  . VAS Consult    HPI: 31 year old male who presents today for evaluation for possible vasectomy.  He has 2 biological children age 80 and 1. He and his wife have agreed and desired no further biological pregnancies. There currently using condoms as her birth control method.  He has no history of significant testicular trauma.  No history of chronic testicular pain. No voiding symptoms.  PMH: Past Medical History:  Diagnosis Date  . Allergy   . Lumbar herniated disc    Had injections to his lower back in 2011    Surgical History: Past Surgical History:  Procedure Laterality Date  . ROOT CANAL Left 2016    Home Medications:  Allergies as of 01/17/2017   No Known Allergies     Medication List    as of 01/17/2017  3:55 PM   You have not been prescribed any medications.     Allergies: No Known Allergies  Family History: Family History  Problem Relation Age of Onset  . Healthy Mother   . Healthy Father   . Cancer Paternal Grandfather        Colon CA, passed away at 26.  Marland Kitchen Healthy Sister   . Healthy Brother   . Diabetes Maternal Aunt   . Heart disease Maternal Grandmother   . Healthy Brother   . Healthy Brother     Social History:  reports that he quit smoking about 14 years ago. He has never used smokeless tobacco. He reports that he does not drink alcohol or use drugs.  ROS: UROLOGY Frequent Urination?: No Hard to postpone urination?: No Burning/pain with urination?: No Get up at night to urinate?: No Leakage of urine?: No Urine stream starts and stops?: No Trouble starting stream?: No Do you have to strain to urinate?: No Blood in urine?: No Urinary tract infection?: No Sexually transmitted disease?: No Injury to kidneys or bladder?: No Painful  intercourse?: No Weak stream?: No Erection problems?: No Penile pain?: No  Gastrointestinal Nausea?: No Vomiting?: No Indigestion/heartburn?: No Diarrhea?: No Constipation?: No  Constitutional Fever: No Night sweats?: No Weight loss?: No Fatigue?: No  Skin Skin rash/lesions?: No Itching?: No  Eyes Blurred vision?: No Double vision?: No  Ears/Nose/Throat Sore throat?: No Sinus problems?: No  Hematologic/Lymphatic Swollen glands?: No Easy bruising?: No  Cardiovascular Leg swelling?: No Chest pain?: No  Respiratory Cough?: No Shortness of breath?: No  Endocrine Excessive thirst?: No  Musculoskeletal Back pain?: No Joint pain?: No  Neurological Headaches?: No Dizziness?: No  Psychologic Depression?: No Anxiety?: No  Physical Exam: BP 140/90   Pulse 97   Ht 5\' 11"  (1.803 m)   Wt 250 lb (113.4 kg)   BMI 34.87 kg/m   Constitutional:  Alert and oriented, No acute distress. HEENT: [place] AT, moist mucus membranes.  Trachea midline, no masses. Cardiovascular: No clubbing, cyanosis, or edema. Respiratory: Normal respiratory effort, no increased work of breathing. GI: Abdomen is soft, nontender, nondistended, no abdominal masses.  Obese.  GU: Circumcised phallus with orthotopic meatus. Bilateral descended testicles, easily palpable vasa bilaterally. No masses. Skin: No rashes, bruises or suspicious lesions. Neurologic: Grossly intact, no focal deficits, moving all 4 extremities. Psychiatric: Normal mood and affect.  Laboratory Data: N/a  Urinalysis N/a  Pertinent Imaging: n/a  Assessment & Plan:  1. Vasectomy evaluation Today, we discussed what the vas deferens is, where it is located, and its function. We reviewed the procedure for vasectomy, it's risks, benefits, alternatives, and likelihood of achieving his goals. We discussed in detail the procedure, complications, and recovery as well as the need for clearance prior to unprotected  intercourse. We discussed that vasectomy does not protect against sexually transmitted diseases. We discussed that this procedure does not result in immediate sterility and that they would need to use other forms of birth control until he has been cleared with negative postvasectomy semen analyses. I explained that the procedure is considered to be permanent and that attempts at reversal have varying degrees of success. These options include vasectomy reversal, sperm retrieval, and in vitro fertilization; these can be very expensive. We discussed the chance of postvasectomy pain syndrome which occurs in less than 5% of patients. I explained to the patient that there is no treatment to resolve this chronic pain, and that if it developed I would not be able to help resolve the issue, but that surgery is generally not needed for correction. I explained there have even been reports of systemic like illness associated with this chronic pain, and that there was no good cure. I explained that vasectomy it is not a 100% reliable form of birth control, and the risk of pregnancy after vasectomy is approximately 1 in 2000 men who had a negative postvasectomy semen analysis or rare non-motile sperm. I explained that repeat vasectomy was necessary in less than 1% of vasectomy procedures when employing the type of technique that I use. I explained that he should refrain from ejaculation for approximately one week following vasectomy. I explained that there are other options for birth control which are permanent and non-permanent; we discussed these. I explained the rates of surgical complications, such as symptomatic hematoma or infection, are low (1-2%) and vary with the surgeon's experience and criteria used to diagnose the complication.  The patient had the opportunity to ask questions to his stated satisfaction. He voiced understanding of the above factors and stated that he has read all the information provided to him and  the packets and informed consent  Patient was offered 10 mg Valium to take 1 hour prior to the procedure to help with periprocedural anxiety. He will have a driver on the day.  Schedule vasectomy  Vanna ScotlandAshley Cannan Beeck, MD  [facility] [address] [place], [place] [address] [phone]

## 2017-03-08 ENCOUNTER — Encounter: Payer: BLUE CROSS/BLUE SHIELD | Admitting: Urology

## 2017-03-21 ENCOUNTER — Telehealth: Payer: Self-pay | Admitting: Urology

## 2017-03-21 MED ORDER — DIAZEPAM 10 MG PO TABS: 10.0000 mg | ORAL_TABLET | Freq: Once | ORAL | 0 refills | Status: AC

## 2017-03-21 NOTE — Telephone Encounter (Signed)
Patient states that you wrote him a script for Valium back in May and he never got it filled. He cancelled his vasectomy in July and rescheduled it for 03/24/17 and not needs another script before Friday. Can this be done?  Miguel Johnston

## 2017-03-24 ENCOUNTER — Encounter: Payer: Self-pay | Admitting: Urology

## 2017-03-24 ENCOUNTER — Ambulatory Visit (INDEPENDENT_AMBULATORY_CARE_PROVIDER_SITE_OTHER): Payer: BLUE CROSS/BLUE SHIELD | Admitting: Urology

## 2017-03-24 VITALS — BP 122/82 | HR 97 | Resp 13 | Ht 67.0 in | Wt 250.0 lb

## 2017-03-24 DIAGNOSIS — Z302 Encounter for sterilization: Secondary | ICD-10-CM | POA: Diagnosis not present

## 2017-03-24 NOTE — Progress Notes (Signed)
03/24/17  CC:  Chief Complaint  Patient presents with  . VAS    HPI: 31 year old male with 2 biological children desiring no further biological pregnancies. He presents today for vasectomy. We purposely discussed risk and benefits and consent was signed. All additional cushions were answered.  Blood pressure 122/82, pulse 97, resp. rate 13, height 5\' 7"  (1.702 m), weight 250 lb (113.4 kg). NED. A&Ox3.   No respiratory distress   Abd soft, NT, ND Normal external genitalia with patent urethral meatus   Bilateral Vasectomy Procedure  Pre-Procedure: - Patient's scrotum was prepped and draped for vasectomy. - The vas was palpated through the scrotal skin on the left. - 1% Xylocaine was injected into the skin and surrounding tissue for placement  - In a similar manner, the vas on the right was identified, anesthetized, and stabilized.  Procedure: - A #11 was used to open the overlying skin via a small stab incision - The left vas was isolated and brought up through the incision exposing that structure. - Bleeding points were cauterized as they occurred. - The vas was free from the surrounding structures and brought to the view. - A segment was positioned for placement with a hemostat. - A second hemostat was placed and a small segment between the two hemostats and was removed for inspection. - Each end of the transected vas lumen was fulgurated/ obliterated using needlepoint electrocautery -A fascial interposition was performed on testicular end of the vas using #3-0 chromic suture -The same procedure was performed on the right. - A single suture of #3-0 chromic catgut was used to close each lateral scrotal skin incision - A dressing was applied.  Post-Procedure: - Patient was instructed in care of the operative area - A specimen is to be delivered in 12 weeks   -Another form of contraception is to be used until post vasectomy semen analysis  Vanna ScotlandAshley Allyssia Skluzacek, MD

## 2017-04-01 ENCOUNTER — Ambulatory Visit
Admission: EM | Admit: 2017-04-01 | Discharge: 2017-04-01 | Disposition: A | Discharge status: Home / Self Care | Payer: BLUE CROSS/BLUE SHIELD | Attending: Nurse Practitioner | Admitting: Nurse Practitioner

## 2017-04-01 ENCOUNTER — Encounter: Payer: Self-pay | Admitting: Gynecology

## 2017-04-01 DIAGNOSIS — Z9852 Vasectomy status: Secondary | ICD-10-CM | POA: Diagnosis not present

## 2017-04-01 DIAGNOSIS — G8918 Other acute postprocedural pain: Secondary | ICD-10-CM

## 2017-04-01 DIAGNOSIS — N50819 Testicular pain, unspecified: Secondary | ICD-10-CM | POA: Diagnosis not present

## 2017-04-01 DIAGNOSIS — R1909 Other intra-abdominal and pelvic swelling, mass and lump: Secondary | ICD-10-CM

## 2017-04-01 DIAGNOSIS — R1031 Right lower quadrant pain: Secondary | ICD-10-CM

## 2017-04-01 DIAGNOSIS — R1030 Lower abdominal pain, unspecified: Secondary | ICD-10-CM

## 2017-04-01 DIAGNOSIS — R1032 Left lower quadrant pain: Secondary | ICD-10-CM

## 2017-04-01 LAB — URINALYSIS, COMPLETE (UACMP) WITH MICROSCOPIC
Bacteria, UA: NONE SEEN
Bilirubin Urine: NEGATIVE
GLUCOSE, UA: NEGATIVE mg/dL
HGB URINE DIPSTICK: NEGATIVE
Ketones, ur: NEGATIVE mg/dL
Leukocytes, UA: NEGATIVE
NITRITE: NEGATIVE
PH: 6.5 (ref 5.0–8.0)
PROTEIN: NEGATIVE mg/dL
Specific Gravity, Urine: 1.025 (ref 1.005–1.030)

## 2017-04-01 NOTE — ED Provider Notes (Signed)
[facility]    CSN: [id number] Arrival date & time: 04/01/17  1191     History   Chief Complaint Chief Complaint  Patient presents with  . Groin Swelling    HPI Miguel Johnston is a 31 y.o. male.   Patient is a fairly healthy 31 y.o. Male, had vasectomy on 03/24/2017, presents today for bilateral groin pain onset 2 days ago. Pain is only present at the moment of getting up and sitting down, walking also aggravates the pain but pain would go away after 1-2 min of walking. He denies any urinary symptoms, reports that he actually feels better when he urinate. Denies blood in urine. He reports that this testicles are swollen.        Past Medical History:  Diagnosis Date  . Allergy   . Lumbar herniated disc    Had injections to his lower back in 2011    Patient Active Problem List   Diagnosis Date Noted  . Environmental allergies 01/06/2017  . Obesity (BMI 30-39.9) 01/06/2017  . Cough 10/20/2015  . Encounter to establish care with new doctor 10/20/2015    Past Surgical History:  Procedure Laterality Date  . ROOT CANAL Left 2016  . VASECTOMY         Home Medications    Prior to Admission medications   Not on File    Family History Family History  Problem Relation Age of Onset  . Healthy Mother   . Healthy Father   . Cancer Paternal Grandfather        Colon CA, passed away at 45.  Marland Kitchen Healthy Sister   . Healthy Brother   . Diabetes Maternal Aunt   . Heart disease Maternal Grandmother   . Healthy Brother   . Healthy Brother     Social History Social History  Substance Use Topics  . Smoking status: Former Smoker    Quit date: 10/19/2002  . Smokeless tobacco: Never Used  . Alcohol use No     Allergies   Patient has no known allergies.   Review of Systems Review of Systems  Constitutional:       See HPI     Physical Exam Triage Vital Signs ED Triage Vitals  Enc Vitals Group     BP 04/01/17 0908 (!) 138/92     Pulse Rate  04/01/17 0908 74     Resp 04/01/17 0908 16     Temp 04/01/17 0908 98 F (36.7 C)     Temp Source 04/01/17 0908 Oral     SpO2 04/01/17 0908 98 %     Weight 04/01/17 0907 250 lb (113.4 kg)     Height --      Head Circumference --      Peak Flow --      Pain Score 04/01/17 0909 5     Pain Loc --      Pain Edu? --      Excl. in GC? --    No data found.   Updated Vital Signs BP (!) 138/92 (BP Location: Left Arm)   Pulse 74   Temp 98 F (36.7 C) (Oral)   Resp 16   Wt 250 lb (113.4 kg)   SpO2 98%   BMI 39.16 kg/m   Visual Acuity Right Eye Distance:   Left Eye Distance:   Bilateral Distance:    Right Eye Near:   Left Eye Near:    Bilateral Near:     Physical Exam  Constitutional: He is oriented to person, place, and time. He appears well-developed and well-nourished.  Cardiovascular: Normal rate, regular rhythm and normal heart sounds.   Pulmonary/Chest: Effort normal and breath sounds normal. He has no wheezes.  Abdominal: Soft. Bowel sounds are normal. There is no tenderness.  Genitourinary: Penis normal. Right testis shows swelling (Left testicle is more swollen than right, no erythema). Right testis shows no mass and no tenderness. Left testis shows swelling. Left testis shows no mass. No penile erythema or penile tenderness. No discharge found.  Genitourinary Comments: No signs of infection at the surgical site.   Neurological: He is alert and oriented to person, place, and time.  Skin: Skin is warm and dry.  Nursing note and vitals reviewed.    UC Treatments / Results  Labs (all labs ordered are listed, but only abnormal results are displayed) Labs Reviewed  URINALYSIS, COMPLETE (UACMP) WITH MICROSCOPIC - Abnormal; Notable for the following:       Result Value   Squamous Epithelial / LPF 0-5 (*)    All other components within normal limits    EKG  EKG Interpretation None       Radiology No results found.  Procedures Procedures (including critical  care time)  Medications Ordered in UC Medications - No data to display   Initial Impression / Assessment and Plan / UC Course  I have reviewed the triage vital signs and the nursing notes.  Pertinent labs & imaging results that were available during my care of the patient were reviewed by me and considered in my medical decision making (see chart for details).   Final Clinical Impressions(s) / UC Diagnoses   Final diagnoses:  Evaluation postoperative testicular pain within 30 days vasectomy  Bilateral groin pain  UA unremarkable.  Consulted with Dr. Mena GoesEskridge with [facility], explained patient's complaint and physical examination finding. Was advised that pain is expected and is normal. Advised patient to take OTC pain reliever for pain relief. F/u with urology if not better.    There are no discharge medications for this patient.  Controlled Substance Prescriptions [place] Controlled Substance Registry consulted? Not Applicable   Miguel Johnston, Miguel Wulff, NP 04/01/17 1057

## 2017-04-01 NOTE — ED Triage Notes (Signed)
Per patient grion pain/ swelling from surgery on 03/24/2017.

## 2017-06-29 ENCOUNTER — Other Ambulatory Visit: Payer: BLUE CROSS/BLUE SHIELD

## 2017-06-29 DIAGNOSIS — Z9852 Vasectomy status: Secondary | ICD-10-CM

## 2017-06-30 LAB — POST-VAS SPERM EVALUATION,QUAL

## 2017-07-03 ENCOUNTER — Telehealth: Payer: Self-pay

## 2017-07-03 NOTE — Telephone Encounter (Signed)
-----   Message from Vanna ScotlandAshley Brandon, MD sent at 07/01/2017  9:43 AM EST ----- Insufficient collection.  Please have him repeat.  Vanna ScotlandAshley Brandon, MD

## 2017-07-03 NOTE — Telephone Encounter (Signed)
Spoke to patient. Gave result and instructions. Patient verbalized understanding.

## 2017-08-30 ENCOUNTER — Ambulatory Visit: Payer: Self-pay | Admitting: Physician Assistant

## 2017-09-28 ENCOUNTER — Other Ambulatory Visit: Payer: BLUE CROSS/BLUE SHIELD

## 2017-09-28 DIAGNOSIS — Z9852 Vasectomy status: Secondary | ICD-10-CM | POA: Diagnosis not present

## 2017-09-29 LAB — POST-VAS SPERM EVALUATION,QUAL: Volume: 1 mL

## 2017-10-02 ENCOUNTER — Telehealth: Payer: Self-pay

## 2017-10-02 NOTE — Telephone Encounter (Signed)
Letter sent.

## 2017-10-02 NOTE — Telephone Encounter (Signed)
-----   Message from Vanna ScotlandAshley Brandon, MD sent at 09/29/2017  5:06 PM EST ----- No sperm present.  Great news.  Vanna ScotlandAshley Brandon, MD

## 2017-11-14 DIAGNOSIS — G4733 Obstructive sleep apnea (adult) (pediatric): Secondary | ICD-10-CM | POA: Diagnosis not present

## 2017-12-28 DIAGNOSIS — R0681 Apnea, not elsewhere classified: Secondary | ICD-10-CM | POA: Diagnosis not present

## 2018-01-10 DIAGNOSIS — G4733 Obstructive sleep apnea (adult) (pediatric): Secondary | ICD-10-CM | POA: Diagnosis not present

## 2018-01-11 DIAGNOSIS — G4733 Obstructive sleep apnea (adult) (pediatric): Secondary | ICD-10-CM | POA: Diagnosis not present

## 2018-01-24 DIAGNOSIS — G4733 Obstructive sleep apnea (adult) (pediatric): Secondary | ICD-10-CM | POA: Diagnosis not present

## 2018-02-23 DIAGNOSIS — G4733 Obstructive sleep apnea (adult) (pediatric): Secondary | ICD-10-CM | POA: Diagnosis not present

## 2018-02-26 DIAGNOSIS — H5212 Myopia, left eye: Secondary | ICD-10-CM | POA: Diagnosis not present

## 2018-03-01 DIAGNOSIS — G4733 Obstructive sleep apnea (adult) (pediatric): Secondary | ICD-10-CM | POA: Diagnosis not present

## 2018-03-12 ENCOUNTER — Encounter: Payer: Self-pay | Admitting: Urology

## 2018-03-12 ENCOUNTER — Ambulatory Visit (INDEPENDENT_AMBULATORY_CARE_PROVIDER_SITE_OTHER): Payer: BLUE CROSS/BLUE SHIELD | Admitting: Urology

## 2018-03-12 VITALS — BP 137/78 | HR 82 | Ht 71.0 in | Wt 254.2 lb

## 2018-03-12 DIAGNOSIS — S3994XA Unspecified injury of external genitals, initial encounter: Secondary | ICD-10-CM | POA: Diagnosis not present

## 2018-03-12 MED ORDER — SULFAMETHOXAZOLE-TRIMETHOPRIM 800-160 MG PO TABS: 1.0000 | ORAL_TABLET | Freq: Two times a day (BID) | ORAL | 0 refills | Status: DC

## 2018-03-12 NOTE — Progress Notes (Signed)
03/12/2018 3:45 PM   Miguel Johnston April 13, 1986 [id number]  Referring provider: Margaretann Loveless, PA-C [address] [place], [place] [address]  Chief Complaint  Patient presents with  . Testicle Pain    intermittent pain, possible injury last week    HPI: Patient is a 32 year old Caucasian male who presents today for scrotal pain.  He states on Friday his right testicle was injured in some fashion while he was getting out of his bucket seats in his car.  Pain was 3/10 pain  It was constant for the next 72 hours.  He has had swelling and the right testicle is riding higher.  Walking made the pain worse.  Swimming eased the pain.  Supportive underwear helps the pain.  No change with urination.  Has not been sexually active as his wife was not willing.  Patient denies any gross hematuria, dysuria or suprapubic/flank pain.  Patient denies any fevers, chills, nausea or vomiting.    PMH: Past Medical History:  Diagnosis Date  . Allergy   . Lumbar herniated disc    Had injections to his lower back in 2011    Surgical History: Past Surgical History:  Procedure Laterality Date  . ROOT CANAL Left 2016  . VASECTOMY      Home Medications:  Allergies as of 03/12/2018   No Known Allergies     Medication List        Accurate as of 03/12/18  3:45 PM. Always use your most recent med list.          fluticasone 50 MCG/ACT nasal spray Commonly known as:  FLONASE INSTILL 1 SPRAY IEN BID PRF ALLERGY SYMPTOMS   sulfamethoxazole-trimethoprim 800-160 MG tablet Commonly known as:  BACTRIM DS,SEPTRA DS Take 1 tablet by mouth every 12 (twelve) hours.       Allergies: No Known Allergies  Family History: Family History  Problem Relation Age of Onset  . Healthy Mother   . Healthy Father   . Cancer Paternal Grandfather        Colon CA, passed away at 32.  Marland Kitchen Healthy Sister   . Healthy Brother   . Diabetes Maternal Aunt   . Heart disease Maternal Grandmother   .  Healthy Brother   . Healthy Brother     Social History:  reports that he quit smoking about 15 years ago. He has never used smokeless tobacco. He reports that he does not drink alcohol or use drugs.  ROS: UROLOGY Frequent Urination?: No Hard to postpone urination?: No Burning/pain with urination?: No Get up at night to urinate?: No Leakage of urine?: No Urine stream starts and stops?: No Trouble starting stream?: No Do you have to strain to urinate?: No Blood in urine?: No Urinary tract infection?: No Sexually transmitted disease?: No Injury to kidneys or bladder?: No Painful intercourse?: No Weak stream?: No Erection problems?: No Penile pain?: No  Gastrointestinal Nausea?: No Vomiting?: No Indigestion/heartburn?: No Diarrhea?: No Constipation?: No  Constitutional Fever: No Night sweats?: No Weight loss?: No Fatigue?: No  Skin Skin rash/lesions?: No Itching?: No  Eyes Blurred vision?: No Double vision?: No  Ears/Nose/Throat Sore throat?: No Sinus problems?: No  Hematologic/Lymphatic Swollen glands?: No Easy bruising?: No  Cardiovascular Leg swelling?: No Chest pain?: No  Respiratory Cough?: No Shortness of breath?: No  Endocrine Excessive thirst?: No  Musculoskeletal Back pain?: No Joint pain?: No  Neurological Headaches?: No Dizziness?: No  Psychologic Depression?: No Anxiety?: No  Physical Exam:  BP 137/78 (BP Location: Left Arm, Patient Position: Sitting, Cuff Size: Large)   Pulse 82   Ht 5\' 11"  (1.803 m)   Wt 254 lb 3.2 oz (115.3 kg)   BMI 35.45 kg/m   Constitutional: Well nourished. Alert and oriented, No acute distress. HEENT: [place] AT, moist mucus membranes. Trachea midline, no masses. Cardiovascular: No clubbing, cyanosis, or edema. Respiratory: Normal respiratory effort, no increased work of breathing. GI: Abdomen is soft, non tender, non distended, no abdominal masses. Liver and spleen not palpable.  No hernias  appreciated.  Stool sample for occult testing is not indicated.   GU: No CVA tenderness.  No bladder fullness or masses.  Patient with circumcised phallus. Urethral meatus is patent.  No penile discharge. No penile lesions or rashes. Scrotum without lesions, cysts, rashes and/or edema.  Testicles are located scrotally bilaterally. No masses are appreciated in the testicles. Left is epididymis is normal.  Right epidiymis is indurated and tender.   Rectal: Not performed.   Skin: No rashes, bruises or suspicious lesions. Lymph: No cervical or inguinal adenopathy. Neurologic: Grossly intact, no focal deficits, moving all 4 extremities. Psychiatric: Normal mood and affect.  Laboratory Data: N/a  Urinalysis N/a  Pertinent Imaging: n/a  Assessment & Plan:    1. Right testicle injury Seems to be improving  Script Septra DS, twice daily for 14 days if symptoms do not improve by tomorrow RTC in 2 weeks if he starts the antibiotic Advised to contact our office or seek treatment in the ED if becomes febrile or pain/ vomiting are difficult control in order to arrange for emergent/urgent intervention    Michiel CowboySHANNON Altie Savard, [facility]  [facility] [address] [place], [place] [address] [phone]

## 2018-03-26 DIAGNOSIS — G4733 Obstructive sleep apnea (adult) (pediatric): Secondary | ICD-10-CM | POA: Diagnosis not present

## 2018-04-18 DIAGNOSIS — J310 Chronic rhinitis: Secondary | ICD-10-CM | POA: Diagnosis not present

## 2018-04-18 DIAGNOSIS — G4733 Obstructive sleep apnea (adult) (pediatric): Secondary | ICD-10-CM | POA: Diagnosis not present

## 2018-05-02 DIAGNOSIS — G4733 Obstructive sleep apnea (adult) (pediatric): Secondary | ICD-10-CM | POA: Diagnosis not present

## 2018-05-26 DIAGNOSIS — G4733 Obstructive sleep apnea (adult) (pediatric): Secondary | ICD-10-CM | POA: Diagnosis not present

## 2018-06-06 DIAGNOSIS — G4733 Obstructive sleep apnea (adult) (pediatric): Secondary | ICD-10-CM | POA: Diagnosis not present

## 2018-06-26 DIAGNOSIS — G4733 Obstructive sleep apnea (adult) (pediatric): Secondary | ICD-10-CM | POA: Diagnosis not present

## 2018-07-26 DIAGNOSIS — G4733 Obstructive sleep apnea (adult) (pediatric): Secondary | ICD-10-CM | POA: Diagnosis not present

## 2018-08-26 DIAGNOSIS — G4733 Obstructive sleep apnea (adult) (pediatric): Secondary | ICD-10-CM | POA: Diagnosis not present

## 2018-12-26 DIAGNOSIS — G4733 Obstructive sleep apnea (adult) (pediatric): Secondary | ICD-10-CM | POA: Diagnosis not present

## 2019-01-31 DIAGNOSIS — H5213 Myopia, bilateral: Secondary | ICD-10-CM | POA: Diagnosis not present

## 2019-04-30 DIAGNOSIS — G4733 Obstructive sleep apnea (adult) (pediatric): Secondary | ICD-10-CM | POA: Diagnosis not present

## 2020-04-08 DIAGNOSIS — M7662 Achilles tendinitis, left leg: Secondary | ICD-10-CM | POA: Diagnosis not present

## 2020-04-17 DIAGNOSIS — Z03818 Encounter for observation for suspected exposure to other biological agents ruled out: Secondary | ICD-10-CM | POA: Diagnosis not present

## 2020-04-17 DIAGNOSIS — Z1152 Encounter for screening for COVID-19: Secondary | ICD-10-CM | POA: Diagnosis not present

## 2020-05-12 DIAGNOSIS — G4733 Obstructive sleep apnea (adult) (pediatric): Secondary | ICD-10-CM | POA: Diagnosis not present

## 2020-07-06 DIAGNOSIS — G4733 Obstructive sleep apnea (adult) (pediatric): Secondary | ICD-10-CM | POA: Diagnosis not present

## 2020-10-30 DIAGNOSIS — G4733 Obstructive sleep apnea (adult) (pediatric): Secondary | ICD-10-CM | POA: Diagnosis not present

## 2021-02-15 DIAGNOSIS — Z20828 Contact with and (suspected) exposure to other viral communicable diseases: Secondary | ICD-10-CM | POA: Diagnosis not present

## 2022-02-21 ENCOUNTER — Telehealth (INDEPENDENT_AMBULATORY_CARE_PROVIDER_SITE_OTHER): Payer: BC Managed Care – PPO | Admitting: Family Medicine

## 2022-02-21 ENCOUNTER — Encounter: Payer: Self-pay | Admitting: Family Medicine

## 2022-02-21 ENCOUNTER — Ambulatory Visit: Payer: BC Managed Care – PPO | Admitting: Family Medicine

## 2022-02-21 VITALS — Ht 69.0 in | Wt 250.0 lb

## 2022-02-21 DIAGNOSIS — Z9109 Other allergy status, other than to drugs and biological substances: Secondary | ICD-10-CM

## 2022-02-21 DIAGNOSIS — E669 Obesity, unspecified: Secondary | ICD-10-CM

## 2022-02-21 DIAGNOSIS — G4733 Obstructive sleep apnea (adult) (pediatric): Secondary | ICD-10-CM | POA: Insufficient documentation

## 2022-02-21 NOTE — Assessment & Plan Note (Signed)
Chronic and well controlled Good compliance with CPAP and symptoms are improved with use

## 2022-02-21 NOTE — Progress Notes (Signed)
I,Sulibeya S Dimas,acting as a Neurosurgeon for Shirlee Latch, MD.,have documented all relevant documentation on the behalf of Shirlee Latch, MD,as directed by  Shirlee Latch, MD while in the presence of Shirlee Latch, MD.  MyChart Video Visit    Virtual Visit via Video Note   This visit type was conducted due to national recommendations for restrictions regarding the COVID-19 Pandemic (e.g. social distancing) in an effort to limit this patient's exposure and mitigate transmission in our community. This patient is at least at moderate risk for complications without adequate follow up. This format is felt to be most appropriate for this patient at this time. Physical exam was limited by quality of the video and audio technology used for the visit.    Patient location: home Provider location: [facility] Persons involved in the visit: patient, provider  I discussed the limitations of evaluation and management by telemedicine and the availability of in person appointments. The patient expressed understanding and agreed to proceed.  Patient: Miguel Johnston   DOB: 1986-06-02   36 y.o. Male  MRN: [id number] Visit Date: 02/21/2022  Today's healthcare provider: Shirlee Latch, MD   Chief Complaint  Patient presents with   New Patient (Initial Visit)   Subjective    HPI  Patient here today to re-establish (last visit with Antony Contras at [facility] on 01/06/17). Patient denies any concerns at this time. OSA - He reports using a CPAP machine every night for about 4 years now. Patient reports right arm/wrist injury at work in earlier this year. Reports ligament/tendon was out of placed - treated with an injection and PT and dry needling. Patient reports having chronic back pain and is usually sees chiropractic one a month.   Allergic rhinitis - seasonal. Takes sinus and cold prn.  No antihistamine  H/o lumbar herniated disc - s/p injections.  F/b chiropractor monthly to keep  back back in control.  Scheduled for wisdom teeth removal later this week  Works at [employer]  Has bruise on inner left thigh - no leg swelling, no pain, no other bruises  Past Medical History:  Diagnosis Date   Allergy    Lumbar herniated disc    Had injections to his lower back in 2011   OSA (obstructive sleep apnea)     Past Surgical History:  Procedure Laterality Date   ROOT CANAL Left 2016   VASECTOMY      Social History   Socioeconomic History   Marital status: Married    Spouse name: Not on file   Number of children: 2   Years of education: Not on file   Highest education level: Not on file  Occupational History   Not on file  Tobacco Use   Smoking status: Former    Years: 0.50    Types: Cigarettes    Quit date: 10/19/2002    Years since quitting: 19.3   Smokeless tobacco: Never  Vaping Use   Vaping Use: Never used  Substance and Sexual Activity   Alcohol use: Not Currently    Comment: rare, social   Drug use: No   Sexual activity: Yes    Partners: Female    Birth control/protection: Surgical  Other Topics Concern   Not on file  Social History Narrative   Not on file   Social Determinants of Health   Financial Resource Strain: Not on file  Food Insecurity: Not on file  Transportation Needs: Not on file  Physical Activity: Not on file  Stress: Not on file  Social Connections: Not on file  Intimate Partner Violence: Not on file   Family History  Problem Relation Age of Onset   Healthy Mother        former smoker   Healthy Father    Healthy Sister    Healthy Brother    Healthy Brother    Healthy Brother    CAD Maternal Grandmother        CABG   Colon cancer Paternal Grandfather        passed away at 35.   Diabetes Maternal Aunt    Breast cancer Neg Hx    Prostate cancer Neg Hx     Medications: Outpatient Medications Prior to Visit  Medication Sig   Magnesium 500 MG CAPS Take 1 capsule by mouth daily in the afternoon.    [DISCONTINUED] fluticasone (FLONASE) 50 MCG/ACT nasal spray INSTILL 1 SPRAY IEN BID PRF ALLERGY SYMPTOMS   [DISCONTINUED] sulfamethoxazole-trimethoprim (BACTRIM DS,SEPTRA DS) 800-160 MG tablet Take 1 tablet by mouth every 12 (twelve) hours.   No facility-administered medications prior to visit.    Review of Systems  Musculoskeletal:  Positive for back pain.  Skin:  Positive for color change.  Allergic/Immunologic: Positive for environmental allergies.  All other systems reviewed and are negative.      Objective    Ht 5\' 9"  (1.753 m)   Wt 250 lb (113.4 kg)   BMI 36.92 kg/m   BP Readings from Last 3 Encounters:  03/12/18 137/78  04/01/17 (!) 138/92  03/24/17 122/82   Wt Readings from Last 3 Encounters:  02/21/22 250 lb (113.4 kg)  02/21/22 250 lb (113.4 kg)  03/12/18 254 lb 3.2 oz (115.3 kg)       Physical Exam Constitutional:      General: He is not in acute distress.    Appearance: Normal appearance. He is not diaphoretic.  HENT:     Head: Normocephalic.  Eyes:     Conjunctiva/sclera: Conjunctivae normal.  Pulmonary:     Effort: Pulmonary effort is normal. No respiratory distress.  Neurological:     Mental Status: He is alert and oriented to person, place, and time. Mental status is at baseline.        Assessment & Plan     Problem List Items Addressed This Visit       Respiratory   OSA (obstructive sleep apnea) - Primary    Chronic and well controlled Good compliance with CPAP and symptoms are improved with use      Relevant Orders   Lipid panel   Comprehensive metabolic panel     Other   Environmental allergies    Seasonal Fairly well controlled Antihistamine prn      Obesity (BMI 30-39.9)    Discussed importance of healthy weight management Discussed diet and exercise  Screening labs today      Relevant Orders   Lipid panel   Comprehensive metabolic panel     Return in about 1 year (around 02/22/2023) for CPE.     I discussed  the assessment and treatment plan with the patient. The patient was provided an opportunity to ask questions and all were answered. The patient agreed with the plan and demonstrated an understanding of the instructions.   The patient was advised to call back or seek an in-person evaluation if the symptoms worsen or if the condition fails to improve as anticipated.  I provided 30 minutes of non-face-to-face time during this encounter.  04/25/2023  Beryle Flock, MD, have reviewed all documentation for this visit. The documentation on 02/21/22 for the exam, diagnosis, procedures, and orders are all accurate and complete.   Tell Rozelle, Marzella Schlein, MD, MPH [facility]

## 2022-02-21 NOTE — Assessment & Plan Note (Signed)
Seasonal Fairly well controlled Antihistamine prn

## 2022-02-21 NOTE — Assessment & Plan Note (Addendum)
Discussed importance of healthy weight management Discussed diet and exercise Screening labs today 

## 2022-02-21 NOTE — Progress Notes (Unsigned)
I,Lurdes Haltiwanger S Clariza Sickman,acting as a Education administrator for Lavon Paganini, MD.,have documented all relevant documentation on the behalf of Lavon Paganini, MD,as directed by  Lavon Paganini, MD while in the presence of Lavon Paganini, MD.  New patient visit   Patient: Miguel Johnston   DOB: [birth year]   36 y.o. Male  MRN: [id number] Visit Date: 02/21/2022  Today's healthcare provider: Lavon Paganini, MD   Chief Complaint  Patient presents with   New Patient (Initial Visit)   Subjective    ESSAM LOWDERMILK is a 36 y.o. male who presents today as a new patient to establish care.  HPI    Past Medical History:  Diagnosis Date   Allergy    Lumbar herniated disc    Had injections to his lower back in 2011   Past Surgical History:  Procedure Laterality Date   ROOT CANAL Left 2016   VASECTOMY     Family Status  Relation Name Status   Mother  Alive   Father  Alive   PGF  Deceased   Sister  Alive   Brother  [place]  Alive   MGM  Alive   Brother  Alive   Brother  Alive   Daughter  Alive   Son  Alive   Family History  Problem Relation Age of Onset   Healthy Mother    Healthy Father    Cancer Paternal Grandfather        Colon CA, passed away at 62.   Healthy Sister    Healthy Brother    Diabetes Maternal Aunt    Heart disease Maternal Grandmother    Healthy Brother    Healthy Brother    Social History   Socioeconomic History   Marital status: Married    Spouse name: Not on file   Number of children: 2   Years of education: Not on file   Highest education level: Not on file  Occupational History   Not on file  Tobacco Use   Smoking status: Former    Types: Cigarettes    Quit date: 10/19/2002    Years since quitting: 19.3   Smokeless tobacco: Never  Vaping Use   Vaping Use: Never used  Substance and Sexual Activity   Alcohol use: No    Alcohol/week: 0.0 standard drinks of alcohol   Drug use: No   Sexual activity: Yes    Partners: Female  Other  Topics Concern   Not on file  Social History Narrative   Not on file   Social Determinants of Health   Financial Resource Strain: Not on file  Food Insecurity: Not on file  Transportation Needs: Not on file  Physical Activity: Not on file  Stress: Not on file  Social Connections: Not on file   Outpatient Medications Prior to Visit  Medication Sig   Magnesium 500 MG CAPS Take 1 capsule by mouth daily in the afternoon.   fluticasone (FLONASE) 50 MCG/ACT nasal spray INSTILL 1 SPRAY IEN BID PRF ALLERGY SYMPTOMS   sulfamethoxazole-trimethoprim (BACTRIM DS,SEPTRA DS) 800-160 MG tablet Take 1 tablet by mouth every 12 (twelve) hours.   No facility-administered medications prior to visit.   No Known Allergies  Immunization History  Administered Date(s) Administered   Hepatitis B 06/03/1996, 07/08/1996, 12/16/1996   Influenza-Unspecified 05/23/2015   Tdap 01/06/2017    Health Maintenance  Topic Date Due   COVID-19 Vaccine (1) Never done   Hepatitis C Screening  Never done  INFLUENZA VACCINE  03/22/2022   TETANUS/TDAP  01/07/2027   HIV Screening  Completed   HPV VACCINES  Aged Out    Patient Care Team: Bacigalupo, Dionne Bucy, MD as PCP - General (Family Medicine)  Review of Systems  Musculoskeletal:  Positive for back pain.  Skin:  Positive for color change.  Allergic/Immunologic: Positive for environmental allergies.    Last CBC No results found for: "WBC", "HGB", "HCT", "MCV", "MCH", "RDW", "PLT" Last metabolic panel No results found for: "GLUCOSE", "NA", "K", "CL", "CO2", "BUN", "CREATININE", "EGFR", "CALCIUM", "PHOS", "PROT", "ALBUMIN", "LABGLOB", "AGRATIO", "BILITOT", "ALKPHOS", "AST", "ALT", "ANIONGAP" Last lipids No results found for: "CHOL", "HDL", "LDLCALC", "LDLDIRECT", "TRIG", "CHOLHDL" Last hemoglobin A1c No results found for: "HGBA1C" Last thyroid functions No results found for: "TSH", "T3TOTAL", "T4TOTAL", "THYROIDAB" Last vitamin D No results found for:  "25OHVITD2", "25OHVITD3", "VD25OH" Last vitamin B12 and Folate No results found for: "VITAMINB12", "FOLATE"     Objective    Ht 5' 9"  (1.753 m)   Wt 250 lb (113.4 kg)   BMI 36.92 kg/m  BP Readings from Last 3 Encounters:  03/12/18 137/78  04/01/17 (!) 138/92  03/24/17 122/82   Wt Readings from Last 3 Encounters:  02/21/22 250 lb (113.4 kg)  03/12/18 254 lb 3.2 oz (115.3 kg)  04/01/17 250 lb (113.4 kg)      Physical Exam ***  Depression Screen    02/21/2022    8:31 AM 01/06/2017    2:19 PM 10/20/2015    9:46 AM  PHQ 2/9 Scores  PHQ - 2 Score 0 0 0  PHQ- 9 Score  0    No results found for any visits on 02/21/22.  Assessment & Plan     ***  No follow-ups on file.     {provider attestation***:1}   Lavon Paganini, MD  [facility] [phone] (phone) [phone] (fax)  [place]

## 2022-03-08 DIAGNOSIS — H52223 Regular astigmatism, bilateral: Secondary | ICD-10-CM | POA: Diagnosis not present

## 2022-03-14 DIAGNOSIS — E669 Obesity, unspecified: Secondary | ICD-10-CM | POA: Diagnosis not present

## 2022-03-14 DIAGNOSIS — G4733 Obstructive sleep apnea (adult) (pediatric): Secondary | ICD-10-CM | POA: Diagnosis not present

## 2022-03-15 LAB — COMPREHENSIVE METABOLIC PANEL
ALT: 81 IU/L — ABNORMAL HIGH (ref 0–44)
AST: 36 IU/L (ref 0–40)
Albumin/Globulin Ratio: 2 (ref 1.2–2.2)
Albumin: 4.3 g/dL (ref 4.1–5.1)
Alkaline Phosphatase: 56 IU/L (ref 44–121)
BUN/Creatinine Ratio: 10 (ref 9–20)
BUN: 12 mg/dL (ref 6–20)
Bilirubin Total: 1.5 mg/dL — ABNORMAL HIGH (ref 0.0–1.2)
CO2: 21 mmol/L (ref 20–29)
Calcium: 9.5 mg/dL (ref 8.7–10.2)
Chloride: 106 mmol/L (ref 96–106)
Creatinine, Ser: 1.15 mg/dL (ref 0.76–1.27)
Globulin, Total: 2.2 g/dL (ref 1.5–4.5)
Glucose: 93 mg/dL (ref 70–99)
Potassium: 4.1 mmol/L (ref 3.5–5.2)
Sodium: 142 mmol/L (ref 134–144)
Total Protein: 6.5 g/dL (ref 6.0–8.5)
eGFR: 84 mL/min/{1.73_m2} (ref >59)

## 2022-03-15 LAB — LIPID PANEL
Chol/HDL Ratio: 5.2 ratio — ABNORMAL HIGH (ref 0.0–5.0)
Cholesterol, Total: 166 mg/dL (ref 100–199)
HDL: 32 mg/dL — ABNORMAL LOW (ref >39)
LDL Chol Calc (NIH): 101 mg/dL — ABNORMAL HIGH (ref 0–99)
Triglycerides: 186 mg/dL — ABNORMAL HIGH (ref 0–149)
VLDL Cholesterol Cal: 33 mg/dL (ref 5–40)

## 2022-03-15 NOTE — Progress Notes (Signed)
Cholesterol shows -elevated fats/triglycerides -low good/HDL cholesterol -borderline high bad/LDL cholesterol I recommend diet low in saturated fat and regular exercise - 30 min at least 5 times per week  Chemistry -elevation seen in one liver enzyme, ALT, can be transient from use of NSAIDs or alcohol. Recommend repeat in 2 months with f/u with GI if still elevated.  Please let us know if you have any questions.  Thank you, Jacky Kindle, FNP  [facility] [address] [place], [place] [address] [phone] (phone) [phone] (fax) [facility]

## 2023-02-27 ENCOUNTER — Ambulatory Visit (INDEPENDENT_AMBULATORY_CARE_PROVIDER_SITE_OTHER): Payer: BC Managed Care – PPO | Admitting: Family Medicine

## 2023-02-27 ENCOUNTER — Encounter: Payer: Self-pay | Admitting: Family Medicine

## 2023-02-27 VITALS — BP 136/84 | HR 81 | Temp 97.7°F | Resp 12 | Ht 69.0 in | Wt 267.7 lb

## 2023-02-27 DIAGNOSIS — E669 Obesity, unspecified: Secondary | ICD-10-CM

## 2023-02-27 DIAGNOSIS — Z0000 Encounter for general adult medical examination without abnormal findings: Secondary | ICD-10-CM

## 2023-02-27 DIAGNOSIS — Z114 Encounter for screening for human immunodeficiency virus [HIV]: Secondary | ICD-10-CM

## 2023-02-27 DIAGNOSIS — G4733 Obstructive sleep apnea (adult) (pediatric): Secondary | ICD-10-CM | POA: Diagnosis not present

## 2023-02-27 DIAGNOSIS — Z1159 Encounter for screening for other viral diseases: Secondary | ICD-10-CM | POA: Diagnosis not present

## 2023-02-27 DIAGNOSIS — H9313 Tinnitus, bilateral: Secondary | ICD-10-CM | POA: Diagnosis not present

## 2023-02-27 DIAGNOSIS — R945 Abnormal results of liver function studies: Secondary | ICD-10-CM | POA: Diagnosis not present

## 2023-02-27 NOTE — Assessment & Plan Note (Signed)
Patient reports recent weight gain and feeling sluggish. Discussed the importance of diet and exercise. Provided education on portion control and the plate method. Encouraged to increase physical activity to 30 minutes, 5 times a week. -Implement dietary changes focusing on portion control and increased vegetable intake. -Increase physical activity to 30 minutes, 5 times a week.

## 2023-02-27 NOTE — Progress Notes (Signed)
I,Sulibeya S Dimas,acting as a Neurosurgeon for Shirlee Latch, MD.,have documented all relevant documentation on the behalf of Shirlee Latch, MD,as directed by  Shirlee Latch, MD while in the presence of Shirlee Latch, MD.    Complete physical exam   Patient: Miguel Johnston   DOB: [birth year]   37 y.o. Male  MRN: [id number] Visit Date: 02/27/2023  Today's healthcare provider: Shirlee Latch, MD   Chief Complaint  Patient presents with   Annual Exam   Subjective    Miguel Johnston is a 37 y.o. male who presents today for a complete physical exam.  He reports consuming a general diet. The patient does not participate in regular exercise at present. He generally feels well. He reports sleeping well. He does not have additional problems to discuss today.  HPI   Discussed the use of AI scribe software for clinical note transcription with the patient, who gave verbal consent to proceed.  History of Present Illness   The patient, with a history of sleep apnea, presents for a routine physical. He reports compliance with CPAP therapy, which he feels is effective. He expresses concern about recent weight gain and feeling sluggish, which he attributes to staying up late and waking up early. He has not been actively trying to manage his weight but expresses a desire to do so. He reports a diet consisting mainly of pork, ground [place], chicken, and pasta, with limited intake of green vegetables. He also mentions occasional ringing in his ears, which has occurred three times in the past month.       Past Medical History:  Diagnosis Date   Allergy    Lumbar herniated disc    Had injections to his lower back in 2011   OSA (obstructive sleep apnea)    Sleep apnea    Dont remember the date   Past Surgical History:  Procedure Laterality Date   ROOT CANAL Left 2016   VASECTOMY     Social History   Socioeconomic History   Marital status: Married    Spouse name: Not on file    Number of children: 2   Years of education: Not on file   Highest education level: Not on file  Occupational History   Not on file  Tobacco Use   Smoking status: Former    Years: .5    Types: Cigarettes    Quit date: 10/19/2002    Years since quitting: 20.3   Smokeless tobacco: Never  Vaping Use   Vaping Use: Never used  Substance and Sexual Activity   Alcohol use: Not Currently    Comment: rare, social   Drug use: No   Sexual activity: Yes    Partners: Female    Birth control/protection: Surgical  Other Topics Concern   Not on file  Social History Narrative   Not on file   Social Determinants of Health   Financial Resource Strain: Not on file  Food Insecurity: Not on file  Transportation Needs: Not on file  Physical Activity: Not on file  Stress: Not on file  Social Connections: Not on file  Intimate Partner Violence: Not on file   Family Status  Relation Name Status   Mother  Alive   Father  Alive   Sister  Alive   Brother S Alive   Brother  Alive   Brother  Alive   MGM F Alive   PGF J Deceased   Daughter  Alive   Son  Alive  Mat Aunt V Alive   Neg Hx  (Not Specified)   Family History  Problem Relation Age of Onset   Healthy Mother        former smoker   Healthy Father    Healthy Sister    Healthy Brother    ADD / ADHD Brother    Healthy Brother    Healthy Brother    CAD Maternal Grandmother        CABG   Heart disease Maternal Grandmother    Colon cancer Paternal Grandfather        passed away at 16.   Cancer Paternal Grandfather    Diabetes Maternal Aunt    Breast cancer Neg Hx    Prostate cancer Neg Hx    No Known Allergies  Patient Care Team: Erasmo Downer, MD as PCP - General (Family Medicine)   Medications: Outpatient Medications Prior to Visit  Medication Sig   Magnesium 500 MG CAPS Take 1 capsule by mouth daily in the afternoon. (Patient not taking: Reported on 02/27/2023)   No facility-administered medications prior to  visit.    Review of Systems  HENT:  Positive for tinnitus.   Endocrine: Positive for polyphagia.  Musculoskeletal:  Positive for back pain.  Allergic/Immunologic: Positive for environmental allergies.  All other systems reviewed and are negative.    Last metabolic panel Lab Results  Component Value Date   GLUCOSE 93 03/14/2022   NA 142 03/14/2022   K 4.1 03/14/2022   CL 106 03/14/2022   CO2 21 03/14/2022   BUN 12 03/14/2022   CREATININE 1.15 03/14/2022   EGFR 84 03/14/2022   CALCIUM 9.5 03/14/2022   PROT 6.5 03/14/2022   ALBUMIN 4.3 03/14/2022   LABGLOB 2.2 03/14/2022   AGRATIO 2.0 03/14/2022   BILITOT 1.5 (H) 03/14/2022   ALKPHOS 56 03/14/2022   AST 36 03/14/2022   ALT 81 (H) 03/14/2022   Last lipids Lab Results  Component Value Date   CHOL 166 03/14/2022   HDL 32 (L) 03/14/2022   LDLCALC 101 (H) 03/14/2022   TRIG 186 (H) 03/14/2022   CHOLHDL 5.2 (H) 03/14/2022   Last hemoglobin A1c No results found for: "HGBA1C"    Objective    BP 136/84 (BP Location: Left Arm, Patient Position: Sitting, Cuff Size: Large)   Pulse 81   Temp 97.7 F (36.5 C) (Temporal)   Resp 12   Ht 5\' 9"  (1.753 m)   Wt 267 lb 11.2 oz (121.4 kg)   SpO2 97%   BMI 39.53 kg/m  BP Readings from Last 3 Encounters:  02/27/23 136/84  03/12/18 137/78  04/01/17 (!) 138/92   Wt Readings from Last 3 Encounters:  02/27/23 267 lb 11.2 oz (121.4 kg)  02/21/22 250 lb (113.4 kg)  02/21/22 250 lb (113.4 kg)      Physical Exam Vitals reviewed.  Constitutional:      General: He is not in acute distress.    Appearance: Normal appearance. He is well-developed. He is not diaphoretic.  HENT:     Head: Normocephalic and atraumatic.     Right Ear: Tympanic membrane, ear canal and external ear normal.     Left Ear: Tympanic membrane, ear canal and external ear normal.     Nose: Nose normal.     Mouth/Throat:     Mouth: Mucous membranes are moist.     Pharynx: Oropharynx is clear. No  oropharyngeal exudate.  Eyes:     General: No scleral icterus.  Conjunctiva/sclera: Conjunctivae normal.     Pupils: Pupils are equal, round, and reactive to light.  Neck:     Thyroid: No thyromegaly.  Cardiovascular:     Rate and Rhythm: Normal rate and regular rhythm.     Heart sounds: Normal heart sounds. No murmur heard. Pulmonary:     Effort: Pulmonary effort is normal. No respiratory distress.     Breath sounds: Normal breath sounds. No wheezing or rales.  Abdominal:     General: There is no distension.     Palpations: Abdomen is soft.     Tenderness: There is no abdominal tenderness.  Musculoskeletal:        General: No deformity.     Cervical back: Neck supple.     Right lower leg: No edema.     Left lower leg: No edema.  Lymphadenopathy:     Cervical: No cervical adenopathy.  Skin:    General: Skin is warm and dry.     Findings: No rash.  Neurological:     Mental Status: He is alert and oriented to person, place, and time. Mental status is at baseline.     Gait: Gait normal.  Psychiatric:        Mood and Affect: Mood normal.        Behavior: Behavior normal.        Thought Content: Thought content normal.       Last depression screening scores    02/27/2023    3:35 PM 02/21/2022    8:31 AM 01/06/2017    2:19 PM  PHQ 2/9 Scores  PHQ - 2 Score 0 0 0  PHQ- 9 Score   0   Last fall risk screening    02/27/2023    3:33 PM  Fall Risk   Falls in the past year? 0  Number falls in past yr: 0  Injury with Fall? 0  Risk for fall due to : No Fall Risks  Follow up Falls evaluation completed   Last Audit-C alcohol use screening    02/21/2022    8:32 AM  Alcohol Use Disorder Test (AUDIT)  1. How often do you have a drink containing alcohol? 1  2. How many drinks containing alcohol do you have on a typical day when you are drinking? 0  3. How often do you have six or more drinks on one occasion? 0  AUDIT-C Score 1   A score of 3 or more in women, and 4 or more in  men indicates increased risk for alcohol abuse, EXCEPT if all of the points are from question 1   No results found for any visits on 02/27/23.  Assessment & Plan    Routine Health Maintenance and Physical Exam  Exercise Activities and Dietary recommendations  Goals   None     Immunization History  Administered Date(s) Administered   Hepatitis B 06/03/1996, 07/08/1996, 12/16/1996   Influenza-Unspecified 05/23/2015   Tdap 01/06/2017    Health Maintenance  Topic Date Due   COVID-19 Vaccine (1) Never done   HIV Screening  Never done   Hepatitis C Screening  Never done   INFLUENZA VACCINE  03/23/2023   DTaP/Tdap/Td (2 - Td or Tdap) 01/07/2027   HPV VACCINES  Aged Out    Discussed health benefits of physical activity, and encouraged him to engage in regular exercise appropriate for his age and condition.  Problem List Items Addressed This Visit       Respiratory   OSA (obstructive  sleep apnea)    Patient reports consistent use of CPAP with good results. -Continue CPAP use nightly.        Other   Obesity (BMI 30-39.9)    Patient reports recent weight gain and feeling sluggish. Discussed the importance of diet and exercise. Provided education on portion control and the plate method. Encouraged to increase physical activity to 30 minutes, 5 times a week. -Implement dietary changes focusing on portion control and increased vegetable intake. -Increase physical activity to 30 minutes, 5 times a week.      Relevant Orders   Comprehensive metabolic panel   Lipid Panel With LDL/HDL Ratio   Other Visit Diagnoses     Encounter for annual health examination    -  Primary   Relevant Orders   Comprehensive metabolic panel   Lipid Panel With LDL/HDL Ratio   HIV Antibody (routine testing w rflx)   Hepatitis C antibody   Screening for HIV (human immunodeficiency virus)       Relevant Orders   HIV Antibody (routine testing w rflx)   Encounter for hepatitis C screening test for  low risk patient       Relevant Orders   Hepatitis C antibody   Tinnitus of both ears       Relevant Orders   Ambulatory referral to ENT           Tinnitus: New onset of ringing in the ears, occurred three times in the past month. No exposure to loud noises. -Refer to ENT for further evaluation.  General Health Maintenance: -Order labs for kidney and liver function, cholesterol, Hepatitis C and HIV screening. -Plan for colon cancer screening at age 49. -Continue annual flu shot, next due in Fall 2024. -Next tetanus shot due in 2028. -Schedule follow-up physical in 1 year, or sooner if needed.        Return in about 1 year (around 02/27/2024) for CPE.     I, Shirlee Latch, MD, have reviewed all documentation for this visit. The documentation on 02/27/23 for the exam, diagnosis, procedures, and orders are all accurate and complete.   Imir Brumbach, Marzella Schlein, MD, MPH [facility]

## 2023-02-27 NOTE — Assessment & Plan Note (Signed)
Patient reports consistent use of CPAP with good results. -Continue CPAP use nightly.

## 2023-02-28 LAB — COMPREHENSIVE METABOLIC PANEL
ALT: 132 IU/L — ABNORMAL HIGH (ref 0–44)
AST: 62 IU/L — ABNORMAL HIGH (ref 0–40)
Albumin: 4.4 g/dL (ref 4.1–5.1)
Alkaline Phosphatase: 51 IU/L (ref 44–121)
BUN/Creatinine Ratio: 13 (ref 9–20)
BUN: 16 mg/dL (ref 6–20)
Bilirubin Total: 1.1 mg/dL (ref 0.0–1.2)
CO2: 22 mmol/L (ref 20–29)
Calcium: 9.7 mg/dL (ref 8.7–10.2)
Chloride: 104 mmol/L (ref 96–106)
Creatinine, Ser: 1.27 mg/dL (ref 0.76–1.27)
Globulin, Total: 2.4 g/dL (ref 1.5–4.5)
Glucose: 85 mg/dL (ref 70–99)
Potassium: 4.2 mmol/L (ref 3.5–5.2)
Sodium: 141 mmol/L (ref 134–144)
Total Protein: 6.8 g/dL (ref 6.0–8.5)
eGFR: 74 mL/min/{1.73_m2} (ref >59)

## 2023-02-28 LAB — HIV ANTIBODY (ROUTINE TESTING W REFLEX): HIV Screen 4th Generation wRfx: NONREACTIVE

## 2023-02-28 LAB — LIPID PANEL WITH LDL/HDL RATIO
Cholesterol, Total: 191 mg/dL (ref 100–199)
HDL: 33 mg/dL — ABNORMAL LOW (ref >39)
LDL Chol Calc (NIH): 111 mg/dL — ABNORMAL HIGH (ref 0–99)
LDL/HDL Ratio: 3.4 ratio (ref 0.0–3.6)
Triglycerides: 271 mg/dL — ABNORMAL HIGH (ref 0–149)
VLDL Cholesterol Cal: 47 mg/dL — ABNORMAL HIGH (ref 5–40)

## 2023-02-28 LAB — HEPATITIS C ANTIBODY: Hep C Virus Ab: NONREACTIVE

## 2023-03-03 ENCOUNTER — Telehealth: Payer: Self-pay

## 2023-03-03 DIAGNOSIS — R7989 Other specified abnormal findings of blood chemistry: Secondary | ICD-10-CM

## 2023-03-03 NOTE — Telephone Encounter (Signed)
-----   Message from Shirlee Latch sent at 03/03/2023 11:17 AM EDT ----- Normal/stable labs, except high cholesterol.  No need for medications, but recommend diet low in saturated fat and regular exercise - 30 min at least 5 times per week.  There has been worsening of the elevated liver function tests.  These may be related to fatty liver disease.  Recommend right upper quadrant ultrasound and acute hepatitis panel.  Seems okay to place these orders for elevated LFTs

## 2023-03-07 LAB — ACUTE VIRAL HEPATITIS (HAV, HBV, HCV)
HCV Ab: NONREACTIVE
Hep A IgM: NEGATIVE
Hep B C IgM: NEGATIVE
Hepatitis B Surface Ag: NEGATIVE

## 2023-03-07 LAB — SPECIMEN STATUS REPORT

## 2023-03-07 LAB — HCV INTERPRETATION

## 2023-03-17 ENCOUNTER — Ambulatory Visit
Admission: RE | Admit: 2023-03-17 | Discharge: 2023-03-17 | Disposition: A | Discharge status: Home / Self Care | Payer: BC Managed Care – PPO | Source: Ambulatory Visit | Attending: Family Medicine | Admitting: Family Medicine

## 2023-03-17 DIAGNOSIS — K838 Other specified diseases of biliary tract: Secondary | ICD-10-CM | POA: Diagnosis not present

## 2023-03-17 DIAGNOSIS — R7989 Other specified abnormal findings of blood chemistry: Secondary | ICD-10-CM | POA: Insufficient documentation

## 2023-03-17 DIAGNOSIS — K760 Fatty (change of) liver, not elsewhere classified: Secondary | ICD-10-CM | POA: Diagnosis not present

## 2023-03-17 DIAGNOSIS — K8020 Calculus of gallbladder without cholecystitis without obstruction: Secondary | ICD-10-CM | POA: Diagnosis not present

## 2023-03-21 DIAGNOSIS — H9042 Sensorineural hearing loss, unilateral, left ear, with unrestricted hearing on the contralateral side: Secondary | ICD-10-CM | POA: Diagnosis not present

## 2023-03-21 DIAGNOSIS — H9311 Tinnitus, right ear: Secondary | ICD-10-CM | POA: Diagnosis not present

## 2023-06-13 ENCOUNTER — Encounter: Payer: Self-pay | Admitting: Family Medicine

## 2023-09-25 DIAGNOSIS — Z79899 Other long term (current) drug therapy: Secondary | ICD-10-CM | POA: Diagnosis not present

## 2023-09-25 DIAGNOSIS — R41840 Attention and concentration deficit: Secondary | ICD-10-CM | POA: Diagnosis not present

## 2023-09-27 DIAGNOSIS — Z79899 Other long term (current) drug therapy: Secondary | ICD-10-CM | POA: Diagnosis not present

## 2023-09-27 DIAGNOSIS — F902 Attention-deficit hyperactivity disorder, combined type: Secondary | ICD-10-CM | POA: Diagnosis not present

## 2023-09-27 DIAGNOSIS — R41840 Attention and concentration deficit: Secondary | ICD-10-CM | POA: Diagnosis not present

## 2023-09-29 DIAGNOSIS — R41840 Attention and concentration deficit: Secondary | ICD-10-CM | POA: Diagnosis not present

## 2023-09-29 DIAGNOSIS — Z79899 Other long term (current) drug therapy: Secondary | ICD-10-CM | POA: Diagnosis not present

## 2023-10-18 DIAGNOSIS — Z79899 Other long term (current) drug therapy: Secondary | ICD-10-CM | POA: Diagnosis not present

## 2023-10-18 DIAGNOSIS — F902 Attention-deficit hyperactivity disorder, combined type: Secondary | ICD-10-CM | POA: Diagnosis not present

## 2023-10-19 DIAGNOSIS — F902 Attention-deficit hyperactivity disorder, combined type: Secondary | ICD-10-CM | POA: Diagnosis not present

## 2023-10-19 DIAGNOSIS — Z79899 Other long term (current) drug therapy: Secondary | ICD-10-CM | POA: Diagnosis not present

## 2023-10-20 DIAGNOSIS — Z79899 Other long term (current) drug therapy: Secondary | ICD-10-CM | POA: Diagnosis not present

## 2023-10-20 DIAGNOSIS — F902 Attention-deficit hyperactivity disorder, combined type: Secondary | ICD-10-CM | POA: Diagnosis not present

## 2024-01-19 DIAGNOSIS — Z79899 Other long term (current) drug therapy: Secondary | ICD-10-CM | POA: Diagnosis not present

## 2024-01-19 DIAGNOSIS — F902 Attention-deficit hyperactivity disorder, combined type: Secondary | ICD-10-CM | POA: Diagnosis not present

## 2024-04-26 DIAGNOSIS — Z79899 Other long term (current) drug therapy: Secondary | ICD-10-CM | POA: Diagnosis not present

## 2024-04-26 DIAGNOSIS — F902 Attention-deficit hyperactivity disorder, combined type: Secondary | ICD-10-CM | POA: Diagnosis not present

## 2024-07-26 DIAGNOSIS — F902 Attention-deficit hyperactivity disorder, combined type: Secondary | ICD-10-CM | POA: Diagnosis not present

## 2024-07-26 DIAGNOSIS — Z79899 Other long term (current) drug therapy: Secondary | ICD-10-CM | POA: Diagnosis not present
